# Patient Record
Sex: Female | Born: 1975 | Race: White | Hispanic: No | Marital: Married | Smoking: Never smoker
Health system: Southern US, Community
[De-identification: ages and names within clinical notes are randomized; demographics above are authoritative.]

## PROBLEM LIST (undated history)

## (undated) DIAGNOSIS — J129 Viral pneumonia, unspecified: Secondary | ICD-10-CM

## (undated) DIAGNOSIS — N6310 Unspecified lump in the right breast, unspecified quadrant: Secondary | ICD-10-CM

## (undated) DIAGNOSIS — Z9289 Personal history of other medical treatment: Secondary | ICD-10-CM

## (undated) DIAGNOSIS — L709 Acne, unspecified: Secondary | ICD-10-CM

## (undated) DIAGNOSIS — N920 Excessive and frequent menstruation with regular cycle: Secondary | ICD-10-CM

## (undated) DIAGNOSIS — N939 Abnormal uterine and vaginal bleeding, unspecified: Secondary | ICD-10-CM

## (undated) HISTORY — DX: Excessive and frequent menstruation with regular cycle: N92.0

## (undated) HISTORY — DX: Abnormal uterine and vaginal bleeding, unspecified: N93.9

## (undated) HISTORY — PX: BREAST ENHANCEMENT SURGERY: SHX7

## (undated) HISTORY — DX: Personal history of other medical treatment: Z92.89

## (undated) HISTORY — DX: Viral pneumonia, unspecified: J12.9

## (undated) HISTORY — DX: Acne, unspecified: L70.9

## (undated) HISTORY — DX: Unspecified lump in the right breast, unspecified quadrant: N63.10

---

## 1999-03-25 ENCOUNTER — Other Ambulatory Visit: Admission: RE | Admit: 1999-03-25 | Discharge: 1999-03-25 | Payer: Self-pay | Admitting: *Deleted

## 2005-09-22 ENCOUNTER — Observation Stay: Payer: Self-pay | Admitting: Unknown Physician Specialty

## 2005-09-24 ENCOUNTER — Ambulatory Visit: Payer: Self-pay | Admitting: Unknown Physician Specialty

## 2005-10-18 ENCOUNTER — Observation Stay: Payer: Self-pay

## 2005-10-26 ENCOUNTER — Inpatient Hospital Stay: Payer: Self-pay | Admitting: Unknown Physician Specialty

## 2005-10-31 ENCOUNTER — Inpatient Hospital Stay: Payer: Self-pay

## 2007-04-21 HISTORY — PX: ABLATION: SHX5711

## 2007-07-05 HISTORY — PX: OTHER SURGICAL HISTORY: SHX169

## 2008-04-20 HISTORY — PX: LAPAROSCOPY: SHX197

## 2008-06-21 ENCOUNTER — Ambulatory Visit: Payer: Self-pay | Admitting: Unknown Physician Specialty

## 2008-06-26 ENCOUNTER — Ambulatory Visit: Payer: Self-pay | Admitting: Unknown Physician Specialty

## 2008-08-21 ENCOUNTER — Ambulatory Visit: Payer: Self-pay | Admitting: Internal Medicine

## 2009-04-06 ENCOUNTER — Emergency Department: Payer: Self-pay | Admitting: Emergency Medicine

## 2011-04-21 HISTORY — PX: BUNIONECTOMY: SHX129

## 2011-06-16 ENCOUNTER — Ambulatory Visit: Payer: Self-pay | Admitting: Obstetrics & Gynecology

## 2011-06-16 DIAGNOSIS — I499 Cardiac arrhythmia, unspecified: Secondary | ICD-10-CM

## 2011-06-16 LAB — CBC
HCT: 38.8 % (ref 35.0–47.0)
MCV: 93 fL (ref 80–100)
Platelet: 201 10*3/uL (ref 150–440)
RBC: 4.15 10*6/uL (ref 3.80–5.20)
RDW: 12.2 % (ref 11.5–14.5)
WBC: 3.4 10*3/uL — ABNORMAL LOW (ref 3.6–11.0)

## 2011-06-25 ENCOUNTER — Ambulatory Visit: Payer: Self-pay | Admitting: Obstetrics & Gynecology

## 2011-06-25 HISTORY — PX: ENDOMETRIAL ABLATION: SHX621

## 2011-06-25 HISTORY — PX: COMBINED HYSTEROSCOPY DIAGNOSTIC / D&C: SUR297

## 2011-06-25 LAB — PREGNANCY, URINE: Pregnancy Test, Urine: NEGATIVE m[IU]/mL

## 2011-08-06 ENCOUNTER — Ambulatory Visit: Payer: Self-pay | Admitting: Podiatry

## 2011-09-19 DIAGNOSIS — J129 Viral pneumonia, unspecified: Secondary | ICD-10-CM

## 2011-09-19 HISTORY — DX: Viral pneumonia, unspecified: J12.9

## 2011-10-05 ENCOUNTER — Ambulatory Visit: Payer: Self-pay | Admitting: Otolaryngology

## 2013-01-18 DIAGNOSIS — N6310 Unspecified lump in the right breast, unspecified quadrant: Secondary | ICD-10-CM

## 2013-01-18 HISTORY — DX: Unspecified lump in the right breast, unspecified quadrant: N63.10

## 2013-02-20 HISTORY — PX: OTHER SURGICAL HISTORY: SHX169

## 2013-02-22 ENCOUNTER — Ambulatory Visit: Payer: Self-pay | Admitting: General Surgery

## 2013-03-23 HISTORY — PX: OTHER SURGICAL HISTORY: SHX169

## 2013-03-23 NOTE — Unmapped External Note (Signed)
 Detailed Operative Note (CSN: [id number])   Date of Surgery: 03/23/2013   Pre-op Diagnosis: right breast atypia  Post-op Diagnosis: same  Procedure(s): 1. MASTECTOMY, PARTIAL (EG, LUMPECTOMY, TYLECTOMY, QUADRANTECTOMY, SEGMENTECTOMY)  2. INTRAOPERATIVE UTILIZATION OF ULTRASOUND FOR GUIDANCE AND EVALUATION OF SPECIMENS  3. TISSUE REARRANGEMENT, LESS THAN 30 CM SQUARED  Performing Service: Surgical Oncology Surgeon(s) and Role:    * Tasha Shawnee Shaggy, DO - Primary    * Alan Deed, DO - Resident - Assisting   Findings: right breast lesion located with needle localization and ultrasound guidance and removed. Specimen mammographic images were obtained. Initial image did not have the clip. Additional lateral margin was sent and showed a clip in center of lesion and appearance of rim of normal tissue around area of concern.  Anesthesia: GETA  Estimated Blood Loss: 20 cc  Complications: None  Specimens:  1. right breast lumpectomy, marked short stitch superior, long stitch lateral   Detailed Operative Note:  Indications: Tasha Garcia, a 37 y.o.-year-old female had an abnormal mammogram. Biopsy of this area showed atypia.  After discussion of alternatives, the patient elected breast conservation therapy. Accordingly, needle localized lumpectomy was planned.  Description of procedure: The breast lesion was localized pre-operatively with needle localization. The patient was brought to the operating room and the side of surgery was verified. General anesthesia was induced. A time-out was completed verifying correct patient, procedure, site, positioning, and special equipment prior to beginning this procedure. The breast and chest wall was then prepped and draped in the usual sterile fashion.   The lesion was identified using ultrasound guidance of the guide wire. A circumareolar incision was made. Flaps were developed and the lesion was resected intact with the guidewire and  oriented. Ultrasound was performed of the lumpectomy specimen and the wire was located in the center of the specimen with a good margin of tissue surrounding the needle. It was then sent for specimen mammography and confirmation was received that the entire target lesion had been resected. The specimen was sent to pathology. The wound was irrigated with dilute hydrogen peroxide and water. Hemostasis was achieved with electrocautery and suture ligation. Tissue rearrangement was performed mobilizing the surrounding breast tissue from the pectoralis fascia and approximating it in a way to minimize the lumpectomy defect measuring 3 cm x 5 cm x 2 cm. The wound was closed with interrupted sutures of 3-0 Vicryl and a subcuticular suture of 4-0 Vicryl. Dermabond dressing was used over the incision. At the end of the operation, all sponge, instrument, and needle counts x 2 were correct.  The patient tolerated the procedure well and was taken to the postanesthesia care unit in satisfactory condition.   Surgeon Notes: I was present and scrubbed for the entire procedure  Tasha Gallagher, DO 03/23/2013 9:51 PM

## 2014-02-08 LAB — HM MAMMOGRAPHY

## 2014-02-12 NOTE — Progress Notes (Signed)
 Patient Name: Tasha Garcia Patient Age: 38 y.o. Encounter Date: 02/12/2014  Referring Physician:  Almarie DELENA Fox, MD [address] [facility] [id number] [place], [place] [address]  Primary Care Provider: Almarie DELENA Fox, MD (General)   Reason for Visit: abnormal right mammogram, history of atypia   HPI Tasha Garcia is a 38 y.o. female who is well known to the breast service with right breast flat epithelial atypia. She presents today for a repeat mammogram and exam. Historically, she presented with a history of a right breast mass that was self detected. Imaging revealed 3mm of microcalcification. She had a biopsy that showed atypia and underwent a right breast needle localized excisional biopsy on 04/02/14 that showed papilloma, FEA and radial scar. No in situ or invasive carcinoma. She returns for follow up with a bilateral mammogram done at [facility] on 02/08/14 that was BIRADS 2. She has noticed a thickening of the right breast since surgery. She denies new breast masses, nipple discharge, or other breast changes. She has history of bilateral breast augmentation in 2013.  She also has a family history of a maternal grandmother with premenopausal breast cancer. Her family tree is female dominated. She plans to undergo genetic testing with her gynecologist at home. We discussed meeting with a geneticist at [facility], but she would like to have testing done closer to home. We discussed that should she be a genetic carrier we would discuss recommendation for high risk screening and prophylactic procedures. Otherwise she may return to annual screening mammography and routine follow up with her primary physician.  FH:  Maternal grandmother with diagnosis of premenopausal breast cancer, age 59, deceased age 30. Maternal uncle leukemia age 30. No other breast or ovarian cancers.  Maternal grandmother had 4 children, 3 boys and 1 girl (patient's mother). One of the female children had leukemia age  8 and is deceased.  Tasha Garcia is one of two children. She has a brother.    Medical history reviewed as below. I have also reviewed additional records as provided. No past medical history on file. Past Surgical History  Procedure Laterality Date  . Cesarean section    . Foot surgery    . Uterine ablation    . Breast surgery      Saline Implants  . Pr mastectomy, partial Right 03/23/2013    Procedure: MASTECTOMY, PARTIAL (EG, LUMPECTOMY, TYLECTOMY, QUADRANTECTOMY, SEGMENTECTOMY);  Surgeon: Kristalyn Kay Gallagher, DO;  [facility];  Service: Surgical Oncology  . Pr adj tiss xfer any area,30.1-60 sqcm Right 03/23/2013    Procedure: ADJACENT TISSUE TRANSFER OR REARRANGEMENT, ANY AREA; DEFECT 30.1 SQ CM TO 60.0 SQ CM;  Surgeon: Kristalyn Kay Gallagher, DO;  [facility];  Service: Surgical Oncology  . Breast biopsy Right     benign   Family History  Problem Relation Age of Onset  . Cancer Father 63    SKIN CANCER  . Breast cancer Maternal Grandmother 48  . Cancer Maternal Uncle 6    leukemia   History   Social History  . Marital Status: Married    Spouse Name: N/A    Number of Children: N/A  . Years of Education: N/A   Occupational History  . Not on file.   Social History Main Topics  . Smoking status: Former Smoker -- 0.25 packs/day for 5 years  . Smokeless tobacco: Not on file  . Alcohol Use: 1.2 oz/week    2 Cans of beer per week  .  Drug Use: No  . Sexual Activity: Not on file   Other Topics Concern  . Not on file   Social History Narrative  Exercises daily. Lives at home with her husband and children. Former smoker, none currently. Occasional alcohol, no drug use.  Current outpatient prescriptions: docusate sodium (COLACE) 100 MG capsule, Take 100 mg by mouth nightly as needed., Disp: , Rfl: ;  ibuprofen (ADVIL,MOTRIN) 200 MG tablet, Take 600 mg by mouth every six (6) hours as needed for pain., Disp: , Rfl: ;  nitrofurantoin, macrocrystal-monohydrate,  (MACROBID) 100 MG capsule, Take 100 mg by mouth continuous as needed. , Disp: , Rfl:  omeprazole (PRILOSEC) 20 MG capsule, Take 20 mg by mouth daily., Disp: , Rfl: ;  rizatriptan (MAXALT) 5 MG tablet, Take 5 mg by mouth as needed for migraine. May repeat in 2 hours if needed, Disp: , Rfl: ;  ROXICET 5-325 mg per tablet, Take 1 tablet by mouth every four (4) hours as needed for pain., Disp: 20 tablet, Rfl: 0 ZOFRAN, AS HYDROCHLORIDE, 4 mg tablet, Take 1 tablet (4 mg total) by mouth every eight (8) hours as needed for nausea., Disp: 30 tablet, Rfl: 1 Allergies  Allergen Reactions  . Erythromycin Other (See Comments)    Unknown, happened as a child    Reproductive History: G3P3 with first pregnancy at age 21. Menarche reported at age 42. LMP 06/2011 secondary to ablation. OCPs yes, for 14 years, stopped age 70. HRT no.  Review of Systems  10 organ systems reviewed and pertinent as noted in HPI.    Physical Examination:  Blood pressure 129/65, pulse 80, temperature 36.7 C (98.1 F), temperature source Oral, resp. rate 16, height 157.5 cm (5' 2.01), weight 58.423 kg (128 lb 12.8 oz). General Appearance:  No acute distress, well appearing and well nourished.  Head:  Normocephalic, atraumatic.  Eyes:  Conjuctiva and lids appear normal. Pupils equal and round,  sclera anicteric.  Ears:  Overall appearance normal with no scars, lesions or               masses.  Hearing is grossly normal.  Nose: Nares grossly normal, no drainage.  Throat: Lips, mucosa, and tongue normal; teeth and gums normal.  Neck: Supple, symmetrical, trachea midline, no adenopathy;  thyroid:  no enlargement/tenderness/nodules.  Pulmonary:    Normal respiratory effort.  Lungs were clear to auscultation bilaterally.  Cardiovascular:  Regular rate and rhythm, no murmur noted.  No thrill noted.  Abdomen:   Soft, non-tender, without masses.  No                                      hepatosplenomegaly. No hernias appreciated.  Normoactive bowel sounds.  Musculoskeletal: Normal gait.  Extremities without clubbing, cyanosis, or           edema.  Skin: Skin color, texture, turgor normal, no rashes or lesions.  Neurologic: No motor abnormalities noted.  Sensation grossly intact.  Lymphatic: Breast: No cervical or supraclavicular LAD noted.  A comprehensive examination of the breasts and chest wall was performed with the patient upright and supine with arms at her sides and above her head.  Right breast normal in size, normal symmetry, normal contour, no dimpling, no skin changes, nipple everted, nipple exhibits no crusting or excoriation, no masses or nodules, no palpable axillary adenopathy, right periareolar incision well healed. Cosmesis is good. She has bilateral breast  implants with inframammary incisions. She has deviation of her implants inferior to the IMF and lateral. Left breast normal in size, normal symmetry, normal contour, no dimpling, no skin changes, nipple everted, nipple exhibits no crusting or excoriation, no masses or nodules, no palpable axillary adenopathy.  Psychiatric: Judgement and insight appropriate.  Oriented to person,         place, and time.    Diagnostic Studies: I personally reviewed all diagnostic imaging, including diagnostic mammogram with spot compression/magnification views. Diagnostic mammography, performed on 02/08/14 at [facility], reveals no abnormalities, BIRADS 2.   Assessment/Plan: Tasha Garcia is a 38 y.o. 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  After reviewing all available records and imaging, we had a frank and thorough discussion regarding her course of care. Current imaging is normal, BIRADS 2.   We specifically discussed:  That her imaging today was BIRADS 2 no abnormalities.  The option of meeting with a geneticist to discuss possible genetic testing secondary to her family history of a maternal grandmother with premenopausal breast cancer and a female  dominated family tree. She plans to undergo genetic testing with her gynecologist at home.  We discussed that should she be a genetic carrier she should follow up to discuss risk reduction strategies.  The recommendation for annual screening mammography and routine follow up with her primary physician.   I spent 30 minutes with Devere, greater than 50% of that time in counseling and coordination of care. All of the patient's and family's questions and concerns were addressed during the visit and they are in agreement with the plan of care. Please do not hesitate to contact me with questions or concerns.  Kristalyn K Gallagher, DO Surgical Breast Oncology and Oncoplastics 02/12/2014 9:50 AM

## 2014-03-01 ENCOUNTER — Ambulatory Visit: Payer: Self-pay | Admitting: Podiatry

## 2014-08-12 NOTE — Op Note (Signed)
PATIENT NAME:  Tasha Garcia, Tasha Garcia MR#:  [id number] DATE OF BIRTH:  [birth year]  DATE OF PROCEDURE:  06/25/2011  PREOPERATIVE DIAGNOSIS: Menorrhagia.   POSTOPERATIVE DIAGNOSIS: Menorrhagia.  PROCEDURES PERFORMED:  1. Hysteroscopy.  2. Dilatation and curettage. 3. NovaSure endometrial ablation.   SURGEON: Barnett Applebaum, M.D.   ANESTHESIA: General.   ESTIMATED BLOOD LOSS: Minimal.   COMPLICATIONS: None.   FINDINGS: Proliferative endometrium.   DISPOSITION: To the Recovery Room in stable condition.   TECHNIQUE: The patient is prepped and draped in the usual sterile fashion, after adequate anesthesia is obtained, in the dorsal lithotomy position. The bladder is drained with a Robinson catheter, a speculum is placed, and the anterior lip of the cervix is grasped with a tenaculum. The uterus is measured with sounding length to 9 cm, cervical length 3 cm, and cavity length 6 cm. Then the cervix is gently dilated to size 18 Pratt dilator. A 30 degree hysteroscope, with lactated Ringer's solution, with distention of the intrauterine cavity is performed with proliferative endometrium noted, but no obvious polyps or fibroids. The hysteroscope is removed and the NovaSure device is placed. Cavity width of 3.8 cm is measured. Machine test is performed and then the NovaSure endometrial ablation procedure is performed on a power 125 which takes 60 seconds. The device is removed and repeat inspection with the hysteroscope reveals appropriate blanching of the endometrial lining with no signs of bleeding, perforation, or other injury. The hysteroscope is removed. The patient goes to the Recovery Room in stable condition.   It is important to note that the endometrial curettage was obtained prior to performing the endometrial ablation and this is sent to pathology for further review.   The patient goes to the Recovery Room n stable condition. All sponge, instrument, and needle counts are  correct. ____________________________ R. Barnett Applebaum, MD rph:slb D: 06/25/2011 14:32:20 ET T: 06/25/2011 14:38:01 ET JOB#: [id number]  cc: Glean Salen, MD, <Dictator> Gae Dry MD ELECTRONICALLY SIGNED 06/26/2011 9:11

## 2015-03-06 DIAGNOSIS — Z9289 Personal history of other medical treatment: Secondary | ICD-10-CM

## 2015-03-06 HISTORY — DX: Personal history of other medical treatment: Z92.89

## 2015-03-06 LAB — HM PAP SMEAR: HM PAP: NEGATIVE

## 2016-07-14 ENCOUNTER — Telehealth: Payer: Self-pay

## 2016-07-14 NOTE — Telephone Encounter (Signed)
Pt calling.  She was a pt of [facility] for 70yr.  She requested refill on her progesterone cream 5d ago which she thinks is ample time.but it's still not refilled.  Also, if refill could be for a year instead of six months, it would save a lot of headaches.  She is now completely of the progesterone cream.  [phone]

## 2016-07-15 ENCOUNTER — Other Ambulatory Visit: Payer: Self-pay

## 2016-07-15 NOTE — Telephone Encounter (Signed)
-----   Message from Gae Dry, MD sent at 07/14/2016  3:47 PM EDT ----- Regarding: appt Please arrange Annual Appt for pt w self as convienant for patient.  Refill will be called in for her progesterone for the next month or so.

## 2016-07-15 NOTE — Telephone Encounter (Signed)
Per pt she wants to see ABC please advise if you will refill pt progesterone cream.

## 2016-07-15 NOTE — Telephone Encounter (Signed)
Pt is schedule 5/88/32 with Elmo Putt Copland

## 2016-07-16 ENCOUNTER — Other Ambulatory Visit: Payer: Self-pay | Admitting: Obstetrics and Gynecology

## 2016-07-16 MED ORDER — AMBULATORY NON FORMULARY MEDICATION: 1 refills | Status: DC

## 2016-07-16 NOTE — Telephone Encounter (Signed)
RN to fax Rx to [facility] and notify pt.

## 2016-07-16 NOTE — Telephone Encounter (Signed)
Left voice message letting pt know rx sent

## 2016-08-27 ENCOUNTER — Ambulatory Visit (INDEPENDENT_AMBULATORY_CARE_PROVIDER_SITE_OTHER): Payer: 59 | Admitting: Obstetrics and Gynecology

## 2016-08-27 ENCOUNTER — Encounter: Payer: Self-pay | Admitting: Obstetrics and Gynecology

## 2016-08-27 VITALS — BP 100/60 | HR 72 | Ht 62.0 in | Wt 131.0 lb

## 2016-08-27 DIAGNOSIS — Z79890 Hormone replacement therapy (postmenopausal): Secondary | ICD-10-CM | POA: Diagnosis not present

## 2016-08-27 DIAGNOSIS — Z803 Family history of malignant neoplasm of breast: Secondary | ICD-10-CM

## 2016-08-27 DIAGNOSIS — Z01419 Encounter for gynecological examination (general) (routine) without abnormal findings: Secondary | ICD-10-CM

## 2016-08-27 DIAGNOSIS — Z1239 Encounter for other screening for malignant neoplasm of breast: Secondary | ICD-10-CM

## 2016-08-27 DIAGNOSIS — Z1231 Encounter for screening mammogram for malignant neoplasm of breast: Secondary | ICD-10-CM | POA: Diagnosis not present

## 2016-08-27 MED ORDER — AMBULATORY NON FORMULARY MEDICATION: 11 refills | Status: DC

## 2016-08-27 NOTE — Progress Notes (Signed)
Chief Complaint  Patient presents with  . Gynecologic Exam     HPI:      [name] is a 41 y.o. [phone] who LMP was No LMP recorded., presents today for her annual examination.  Her menses are absent due to ablation. She does not have intermenstrual bleeding.  Sex activity: single partner, contraception - vasectomy.  Last Pap: March 06, 2015  Results were: no abnormalities /neg HPV DNA   There is a FH of breast cancer in her MGM, genetic testing not indicated. There is no FH of ovarian cancer. The patient does do self-breast exams. She had a breast mass removed a few yrs ago and is still followed at [facility] with mammograms. She had a neg mammo 10/17.  Tobacco use: The patient denies current or previous tobacco use. Alcohol use: social drinker Exercise: very active  She does get adequate calcium and Vitamin D in her diet.    Past Medical History:  Diagnosis Date  . Abnormal uterine bleeding   . Breast mass, right 01/2013  . History of mammogram 02/06/2013; 02/08/2014   MAMM REC ADD VIEWS-ADD VIEWS REC BX-BX REC SUIRG - BENIGN;  BENIGN  . History of Papanicolaou smear of cervix 03/06/2015   -/-  . Menorrhagia   . Pneumonia, viral 09/2011   walking pneumonia/sinus inf. - took antibxs    Past Surgical History:  Procedure Laterality Date  . BREAST ENHANCEMENT SURGERY    . BUNIONECTOMY  2013  . CESAREAN SECTION     X3  . COMBINED HYSTEROSCOPY DIAGNOSTIC / D&C  06/25/2011   MENORRHAGIA  . ENDOMETRIAL ABLATION  06/25/2011   NOVASURE  . LAPAROSCOPY  2010   [facility]  . LUMPECTOMY, RIGHT BREAST  03/23/2013   DR. GALLAGHER @ [facility]  . right breast biopsy  02/20/2013   DR. Martinique @ [facility]  . SONOHYSTEROGRAM  07/05/2007    Family History  Problem Relation Age of Onset  . Hyperlipidemia Mother   . Hypertension Mother   . Hyperlipidemia Father   . Hypertension Father   . Breast cancer Maternal Grandmother 18    Social History   Social History  . Marital status:  Married    Spouse name: N/A  . Number of children: 3  . Years of education: N/A   Occupational History  . SALES    Social History Main Topics  . Smoking status: Never Smoker  . Smokeless tobacco: Never Used  . Alcohol use Yes     Comment: OCC  . Drug use: No  . Sexual activity: Yes    Birth control/ protection: Surgical   Other Topics Concern  . Not on file   Social History Narrative  . No narrative on file     Current Outpatient Prescriptions:  .  rizatriptan (MAXALT) 10 MG tablet, Take by mouth., Disp: , Rfl:  .  famotidine (PEPCID) 20 MG tablet, Take by mouth., Disp: , Rfl:  .  ibuprofen (ADVIL,MOTRIN) 200 MG tablet, Take 600 mg by mouth., Disp: , Rfl:  .  omeprazole (PRILOSEC) 40 MG capsule, Take 40 mg by mouth every morning., Disp: , Rfl: 3  ROS:  Review of Systems  Constitutional: Negative for fever, malaise/fatigue and weight loss.  HENT: Negative for congestion, ear pain and sinus pain.   Respiratory: Negative for cough, shortness of breath and wheezing.   Cardiovascular: Negative for chest pain, orthopnea and leg swelling.  Gastrointestinal: Negative for constipation, diarrhea, nausea and vomiting.  Genitourinary: Negative for  dysuria, frequency, hematuria and urgency.       Breast ROS: negative   Musculoskeletal: Negative for back pain, joint pain and myalgias.  Skin: Negative for itching and rash.  Neurological: Negative for dizziness, tingling, focal weakness and headaches.  Endo/Heme/Allergies: Negative for environmental allergies. Does not bruise/bleed easily.  Psychiatric/Behavioral: Negative for depression and suicidal ideas. The patient is not nervous/anxious and does not have insomnia.     Objective: BP 100/60   Pulse 72   Ht 5' 2"  (1.575 m)   Wt 131 lb (59.4 kg)   BMI 23.96 kg/m    Physical Exam  Constitutional: She is oriented to person, place, and time. She appears well-developed and well-nourished.  Genitourinary: Vagina normal and  uterus normal. There is no rash or tenderness on the right labia. There is no rash or tenderness on the left labia. No erythema or tenderness in the vagina. No vaginal discharge found. Right adnexum does not display mass and does not display tenderness. Left adnexum does not display mass and does not display tenderness. Cervix does not exhibit motion tenderness or polyp. Uterus is not enlarged or tender.  Neck: Normal range of motion. No thyromegaly present.  Cardiovascular: Normal rate, regular rhythm and normal heart sounds.   No murmur heard. Pulmonary/Chest: Effort normal and breath sounds normal. Right breast exhibits no mass, no nipple discharge, no skin change and no tenderness. Left breast exhibits no mass, no nipple discharge, no skin change and no tenderness.  Abdominal: Soft. There is no tenderness. There is no guarding.  Musculoskeletal: Normal range of motion.  Neurological: She is alert and oriented to person, place, and time. No cranial nerve deficit.  Psychiatric: She has a normal mood and affect. Her behavior is normal.  Vitals reviewed.   Assessment/Plan: Encounter for annual routine gynecological examination  Screening for breast cancer - Pt has mammos at [facility]. - Plan: CANCELED: MM SCREENING BREAST TOMO BILATERAL  Family history of breast cancer - Doesn't currently qualify for cancer genetic testing. Will cont to follow FH. - Plan: CANCELED: MM SCREENING BREAST TOMO BILATERAL  Hormone replacement therapy (HRT) - Pt uses progesteron crm, compounded at [facility], for acne with sx relief.              GYN counsel mammography screening, adequate intake of calcium and vitamin D     F/U  Return in about 1 year (around 08/27/2017).  Talor Desrosiers B. Vijay Durflinger, PA-C 08/27/2016 11:14 AM

## 2016-09-02 ENCOUNTER — Ambulatory Visit
Admission: RE | Admit: 2016-09-02 | Discharge: 2016-09-02 | Disposition: A | Discharge status: Home / Self Care | Payer: 59 | Source: Ambulatory Visit | Attending: Chiropractic Medicine | Admitting: Chiropractic Medicine

## 2016-09-02 ENCOUNTER — Other Ambulatory Visit: Payer: Self-pay | Admitting: Chiropractic Medicine

## 2016-09-02 DIAGNOSIS — M2560 Stiffness of unspecified joint, not elsewhere classified: Secondary | ICD-10-CM

## 2016-09-02 DIAGNOSIS — M542 Cervicalgia: Secondary | ICD-10-CM | POA: Diagnosis present

## 2017-09-06 ENCOUNTER — Telehealth: Payer: Self-pay

## 2017-09-06 ENCOUNTER — Other Ambulatory Visit: Payer: Self-pay

## 2017-09-06 ENCOUNTER — Other Ambulatory Visit: Payer: Self-pay | Admitting: Obstetrics and Gynecology

## 2017-09-06 DIAGNOSIS — Z79890 Hormone replacement therapy (postmenopausal): Secondary | ICD-10-CM

## 2017-09-06 MED ORDER — AMBULATORY NON FORMULARY MEDICATION: 0 refills | Status: DC

## 2017-09-06 NOTE — Telephone Encounter (Signed)
RN to fax and notify pt.

## 2017-09-06 NOTE — Progress Notes (Signed)
Rx RF till annual. RN to fax.

## 2017-09-06 NOTE — Telephone Encounter (Signed)
Pt states she scheduled her annual for 6/6. She needs her Progesterone called in to the [facility] in [place]. It is due tomorrow 5/21. CB# [phone]

## 2017-09-06 NOTE — Telephone Encounter (Signed)
Rx called in to [facility], pt notified

## 2017-09-23 ENCOUNTER — Encounter: Payer: Self-pay | Admitting: Obstetrics and Gynecology

## 2017-09-23 ENCOUNTER — Ambulatory Visit (INDEPENDENT_AMBULATORY_CARE_PROVIDER_SITE_OTHER): Payer: 59 | Admitting: Obstetrics and Gynecology

## 2017-09-23 VITALS — BP 130/82 | HR 75 | Ht 63.0 in | Wt 132.5 lb

## 2017-09-23 DIAGNOSIS — N644 Mastodynia: Secondary | ICD-10-CM | POA: Diagnosis not present

## 2017-09-23 DIAGNOSIS — L700 Acne vulgaris: Secondary | ICD-10-CM | POA: Diagnosis not present

## 2017-09-23 DIAGNOSIS — Z1239 Encounter for other screening for malignant neoplasm of breast: Secondary | ICD-10-CM

## 2017-09-23 DIAGNOSIS — Z79890 Hormone replacement therapy (postmenopausal): Secondary | ICD-10-CM

## 2017-09-23 DIAGNOSIS — Z803 Family history of malignant neoplasm of breast: Secondary | ICD-10-CM | POA: Diagnosis not present

## 2017-09-23 DIAGNOSIS — Z01419 Encounter for gynecological examination (general) (routine) without abnormal findings: Secondary | ICD-10-CM

## 2017-09-23 DIAGNOSIS — Z1231 Encounter for screening mammogram for malignant neoplasm of breast: Secondary | ICD-10-CM | POA: Diagnosis not present

## 2017-09-23 MED ORDER — AMBULATORY NON FORMULARY MEDICATION: 12 refills | Status: DC

## 2017-09-23 NOTE — Progress Notes (Signed)
Chief Complaint  Patient presents with  . Gynecologic Exam    Breast tenderness      HPI:      [name] is a 42 y.o. [phone] who LMP was No LMP recorded. Patient has had an ablation., presents today for her annual examination.  Her menses are absent due to ablation. She does not have intermenstrual bleeding.  Sex activity: single partner, contraception - vasectomy.  Last Pap: March 06, 2015  Results were: no abnormalities /neg HPV DNA   Mammogram: followed at [facility]. Didn't have one last yr but will do it this yr. She had a breast mass removed a few yrs ago and is still followed at [facility]. She had a neg mammo 10/17. There is a FH of breast cancer in her MGM, genetic testing not indicated. There is no FH of ovarian cancer. The patient does do self-breast exams. She has noted increased breast tenderness recently, sx intensity waxes and wanes. Drinks coffee in the AM.   Tobacco use: The patient denies current or previous tobacco use. Alcohol use: social drinker Exercise: very active  She does get adequate calcium and Vitamin D in her diet.  She uses prog crm 6 days on, 1 day off for acne with sx control. She needs Rx RF. She has had a small of increased acne around her mouth recently. She wonders if dose needs to be adjusted.   Past Medical History:  Diagnosis Date  . Abnormal uterine bleeding   . Breast mass, right 01/2013  . History of mammogram 02/06/2013; 02/08/2014   MAMM REC ADD VIEWS-ADD VIEWS REC BX-BX REC SUIRG - BENIGN;  BENIGN  . History of Papanicolaou smear of cervix 03/06/2015   -/-  . Menorrhagia   . Pneumonia, viral 09/2011   walking pneumonia/sinus inf. - took antibxs    Past Surgical History:  Procedure Laterality Date  . ABLATION  2009  . BREAST ENHANCEMENT SURGERY    . BUNIONECTOMY  2013  . CESAREAN SECTION     X3  . COMBINED HYSTEROSCOPY DIAGNOSTIC / D&C  06/25/2011   MENORRHAGIA  . ENDOMETRIAL ABLATION  06/25/2011   NOVASURE  .  LAPAROSCOPY  2010   BVD  . LUMPECTOMY, RIGHT BREAST  03/23/2013   DR. GALLAGHER @ [facility]  . right breast biopsy  02/20/2013   DR. Martinique @ [facility]  . SONOHYSTEROGRAM  07/05/2007    Family History  Problem Relation Age of Onset  . Hyperlipidemia Mother   . Hypertension Mother   . Hyperlipidemia Father   . Hypertension Father   . Breast cancer Maternal Grandmother 40    Social History   Socioeconomic History  . Marital status: Married    Spouse name: Not on file  . Number of children: 3  . Years of education: Not on file  . Highest education level: Not on file  Occupational History  . Occupation: Press photographer  Social Needs  . Financial resource strain: Not on file  . Food insecurity:    Worry: Not on file    Inability: Not on file  . Transportation needs:    Medical: Not on file    Non-medical: Not on file  Tobacco Use  . Smoking status: Never Smoker  . Smokeless tobacco: Never Used  Substance and Sexual Activity  . Alcohol use: Yes    Comment: OCC  . Drug use: No  . Sexual activity: Yes    Birth control/protection: Surgical    Comment: Vesectomy  Lifestyle  . Physical activity:    Days per week: Not on file    Minutes per session: Not on file  . Stress: Not on file  Relationships  . Social connections:    Talks on phone: Not on file    Gets together: Not on file    Attends religious service: Not on file    Active member of club or organization: Not on file    Attends meetings of clubs or organizations: Not on file    Relationship status: Not on file  . Intimate partner violence:    Fear of current or ex partner: Not on file    Emotionally abused: Not on file    Physically abused: Not on file    Forced sexual activity: Not on file  Other Topics Concern  . Not on file  Social History Narrative  . Not on file    Current Outpatient Medications on File Prior to Visit  Medication Sig Dispense Refill  . amoxicillin-clavulanate (AUGMENTIN) 875-125 MG tablet Take 1  tablet by mouth every 12 (twelve) hours.  0  . ibuprofen (ADVIL,MOTRIN) 200 MG tablet Take 600 mg by mouth.    Marland Kitchen omeprazole (PRILOSEC) 40 MG capsule Take 40 mg by mouth every morning.  3  . rizatriptan (MAXALT) 10 MG tablet Take by mouth.     No current facility-administered medications on file prior to visit.       ROS:  Review of Systems  Constitutional: Negative for fatigue, fever and unexpected weight change.  Respiratory: Negative for cough, shortness of breath and wheezing.   Cardiovascular: Negative for chest pain, palpitations and leg swelling.  Gastrointestinal: Negative for blood in stool, constipation, diarrhea, nausea and vomiting.  Endocrine: Negative for cold intolerance, heat intolerance and polyuria.  Genitourinary: Negative for dyspareunia, dysuria, flank pain, frequency, genital sores, hematuria, menstrual problem, pelvic pain, urgency, vaginal bleeding, vaginal discharge and vaginal pain.  Musculoskeletal: Negative for back pain, joint swelling and myalgias.  Skin: Negative for rash.  Neurological: Negative for dizziness, syncope, light-headedness, numbness and headaches.  Hematological: Negative for adenopathy.  Psychiatric/Behavioral: Negative for agitation, confusion, sleep disturbance and suicidal ideas. The patient is not nervous/anxious.   BREAST: Pos for tenderness   Objective: BP 130/82   Pulse 75   Ht 5' 3"  (1.6 m)   Wt 132 lb 8 oz (60.1 kg)   BMI 23.47 kg/m    Physical Exam  Constitutional: She is oriented to person, place, and time. She appears well-developed and well-nourished.  Genitourinary: Vagina normal and uterus normal. There is no rash or tenderness on the right labia. There is no rash or tenderness on the left labia. No erythema or tenderness in the vagina. No vaginal discharge found. Right adnexum does not display mass and does not display tenderness. Left adnexum does not display mass and does not display tenderness. Cervix does not  exhibit motion tenderness or polyp. Uterus is not enlarged or tender.  Neck: Normal range of motion. No thyromegaly present.  Cardiovascular: Normal rate, regular rhythm and normal heart sounds.  No murmur heard. Pulmonary/Chest: Effort normal and breath sounds normal. Right breast exhibits no mass, no nipple discharge, no skin change and no tenderness. Left breast exhibits no mass, no nipple discharge, no skin change and no tenderness.  Abdominal: Soft. There is no tenderness. There is no guarding.  Musculoskeletal: Normal range of motion.  Neurological: She is alert and oriented to person, place, and time. No cranial nerve deficit.  Psychiatric: She  has a normal mood and affect. Her behavior is normal.  Vitals reviewed.   Assessment/Plan: Encounter for annual routine gynecological examination  Screening for breast cancer - Pt gets mammos at [facility]  Family history of breast cancer - Doesn't qualify for cancer genetic testing but will cont to monitor FH.   Acne vulgaris - Rx RF prog. Cont current dose and f/u prn.  - Plan: AMBULATORY NON FORMULARY MEDICATION  Breast tenderness - NEg exam. D/C caffeine to see if helps. May need to adjust prog dose but still have ovaries so should have sufficient hormones. See if sx are cyclical.  Hormone replacement therapy (HRT) - Pt uses progesteron crm, compounded at [facility], for acne with sx relief.  - Plan: AMBULATORY NON FORMULARY MEDICATION  Meds ordered this encounter  Medications  . AMBULATORY NON FORMULARY MEDICATION    Sig: Progesterone 0.5% cream Apply 1 ml topically daily 6 days on, 1 day off    Dispense:  30 mL    Refill:  12    Order Specific Question:   Supervising Provider    Answer:   Gae Dry [[id number]]             GYN counsel breast self exam, mammography screening, adequate intake of calcium and vitamin D, diet and exercise     F/U  Return in about 1 year (around 09/24/2018).  Alicia B. Copland, PA-C 09/23/2017 12:20  PM

## 2017-09-23 NOTE — Patient Instructions (Signed)
I value your feedback and entrusting us with your care. If you get a  patient survey, I would appreciate you taking the time to let us know about your experience today. Thank you! 

## 2018-10-03 ENCOUNTER — Telehealth: Payer: Self-pay

## 2018-10-03 NOTE — Telephone Encounter (Signed)
Pt calling; states pharmacy sent request for refill Friday for progesterone cream.  Pt calling to be sure we got it b/c last time it took a week for her to get it.  [phone]

## 2018-10-03 NOTE — Telephone Encounter (Signed)
Have not received request from pharmacy. Please advise.

## 2018-10-03 NOTE — Telephone Encounter (Signed)
Rx fax received. Pls fax back and notify pt it's done. She is due for annual. Thx.

## 2018-10-03 NOTE — Telephone Encounter (Signed)
Rx faxed and pt aware.

## 2018-10-05 ENCOUNTER — Ambulatory Visit (INDEPENDENT_AMBULATORY_CARE_PROVIDER_SITE_OTHER): Payer: 59 | Admitting: Obstetrics and Gynecology

## 2018-10-05 ENCOUNTER — Encounter: Payer: Self-pay | Admitting: Obstetrics and Gynecology

## 2018-10-05 ENCOUNTER — Other Ambulatory Visit: Payer: Self-pay

## 2018-10-05 ENCOUNTER — Other Ambulatory Visit (HOSPITAL_COMMUNITY)
Admission: RE | Admit: 2018-10-05 | Discharge: 2018-10-05 | Disposition: A | Discharge status: Home / Self Care | Payer: 59 | Source: Ambulatory Visit | Attending: Obstetrics and Gynecology | Admitting: Obstetrics and Gynecology

## 2018-10-05 VITALS — BP 104/70 | Ht 63.0 in | Wt 135.6 lb

## 2018-10-05 DIAGNOSIS — Z124 Encounter for screening for malignant neoplasm of cervix: Secondary | ICD-10-CM | POA: Insufficient documentation

## 2018-10-05 DIAGNOSIS — Z01419 Encounter for gynecological examination (general) (routine) without abnormal findings: Secondary | ICD-10-CM

## 2018-10-05 DIAGNOSIS — L700 Acne vulgaris: Secondary | ICD-10-CM | POA: Insufficient documentation

## 2018-10-05 DIAGNOSIS — Z1151 Encounter for screening for human papillomavirus (HPV): Secondary | ICD-10-CM

## 2018-10-05 DIAGNOSIS — Z1329 Encounter for screening for other suspected endocrine disorder: Secondary | ICD-10-CM

## 2018-10-05 DIAGNOSIS — Z79890 Hormone replacement therapy (postmenopausal): Secondary | ICD-10-CM

## 2018-10-05 DIAGNOSIS — Z1239 Encounter for other screening for malignant neoplasm of breast: Secondary | ICD-10-CM

## 2018-10-05 DIAGNOSIS — R635 Abnormal weight gain: Secondary | ICD-10-CM

## 2018-10-05 MED ORDER — AMBULATORY NON FORMULARY MEDICATION: 12 refills | Status: DC

## 2018-10-05 NOTE — Progress Notes (Signed)
Chief Complaint  Patient presents with  . Gynecologic Exam     HPI:      Tasha Garcia is a 43 y.o. [phone] who LMP was No LMP recorded. Patient has had an ablation., presents today for her annual examination.  Her menses are absent due to ablation. She does not have intermenstrual bleeding. No vasomotor sx.  Sex activity: single partner, contraception - vasectomy.  Last Pap: March 06, 2015  Results were: no abnormalities /neg HPV DNA   Mammogram: followed at [facility]. Done 10/19. She had a breast mass removed a few yrs ago and is still followed at [facility].  There is a FH of breast cancer in her MGM, genetic testing not indicated. There is no FH of ovarian cancer. The patient does do self-breast exams. Has occas pain at bx site RT breast. Hard to know if related to menstrual cycle since amenorrheic.   Tobacco use: The patient denies current or previous tobacco use. Alcohol use: almost daily/wknds Exercise: very active  She does not get adequate calcium and Vitamin D in her diet.  She uses prog crm 6 days on, 1 day off for acne with sx control. She needs Rx RF.  She has also noticed difficulty losing wt. She exercises regularly (although less now with covid) and hasn't changed diet. She wonders if adjusting prog dose would be helpful. Also with mood changes that are hard to "get over".    Past Medical History:  Diagnosis Date  . Abnormal uterine bleeding   . Breast mass, right 01/2013  . History of mammogram 02/06/2013; 02/08/2014   MAMM REC ADD VIEWS-ADD VIEWS REC BX-BX REC SUIRG - BENIGN;  BENIGN  . History of Papanicolaou smear of cervix 03/06/2015   -/-  . Menorrhagia   . Pneumonia, viral 09/2011   walking pneumonia/sinus inf. - took antibxs    Past Surgical History:  Procedure Laterality Date  . ABLATION  2009  . BREAST ENHANCEMENT SURGERY    . BUNIONECTOMY  2013  . CESAREAN SECTION     X3  . COMBINED HYSTEROSCOPY DIAGNOSTIC / D&C  06/25/2011    MENORRHAGIA  . ENDOMETRIAL ABLATION  06/25/2011   NOVASURE  . LAPAROSCOPY  2010   BVD  . LUMPECTOMY, RIGHT BREAST  03/23/2013   DR. GALLAGHER @ [facility]  . right breast biopsy  02/20/2013   DR. Martinique @ [facility]  . SONOHYSTEROGRAM  07/05/2007    Family History  Problem Relation Age of Onset  . Hyperlipidemia Mother   . Hypertension Mother   . Hyperlipidemia Father   . Hypertension Father   . Breast cancer Maternal Grandmother 15    Social History   Socioeconomic History  . Marital status: Married    Spouse name: Not on file  . Number of children: 3  . Years of education: Not on file  . Highest education level: Not on file  Occupational History  . Occupation: Press photographer  Social Needs  . Financial resource strain: Not on file  . Food insecurity    Worry: Not on file    Inability: Not on file  . Transportation needs    Medical: Not on file    Non-medical: Not on file  Tobacco Use  . Smoking status: Never Smoker  . Smokeless tobacco: Never Used  Substance and Sexual Activity  . Alcohol use: Yes    Comment: OCC  . Drug use: No  . Sexual activity: Yes    Birth control/protection: Surgical  Comment: Vesectomy   Lifestyle  . Physical activity    Days per week: Not on file    Minutes per session: Not on file  . Stress: Not on file  Relationships  . Social Herbalist on phone: Not on file    Gets together: Not on file    Attends religious service: Not on file    Active member of club or organization: Not on file    Attends meetings of clubs or organizations: Not on file    Relationship status: Not on file  . Intimate partner violence    Fear of current or ex partner: Not on file    Emotionally abused: Not on file    Physically abused: Not on file    Forced sexual activity: Not on file  Other Topics Concern  . Not on file  Social History Narrative  . Not on file    Current Outpatient Medications on File Prior to Visit  Medication Sig Dispense Refill  .  Hormone Cream Base (HRT CREAM BASE WOMEN) CREA     . ibuprofen (ADVIL,MOTRIN) 200 MG tablet Take 600 mg by mouth.    Marland Kitchen omeprazole (PRILOSEC) 40 MG capsule Take 40 mg by mouth every morning.  3  . rizatriptan (MAXALT) 10 MG tablet Take by mouth.     No current facility-administered medications on file prior to visit.       ROS:  Review of Systems  Constitutional: Negative for fatigue, fever and unexpected weight change.  Respiratory: Negative for cough, shortness of breath and wheezing.   Cardiovascular: Negative for chest pain, palpitations and leg swelling.  Gastrointestinal: Negative for blood in stool, constipation, diarrhea, nausea and vomiting.  Endocrine: Negative for cold intolerance, heat intolerance and polyuria.  Genitourinary: Negative for dyspareunia, dysuria, flank pain, frequency, genital sores, hematuria, menstrual problem, pelvic pain, urgency, vaginal bleeding, vaginal discharge and vaginal pain.  Musculoskeletal: Negative for back pain, joint swelling and myalgias.  Skin: Negative for rash.  Neurological: Negative for dizziness, syncope, light-headedness, numbness and headaches.  Hematological: Negative for adenopathy.  Psychiatric/Behavioral: Negative for agitation, confusion, sleep disturbance and suicidal ideas. The patient is not nervous/anxious.   BREAST: Pos for tenderness   Objective: BP 104/70   Ht 5' 3"  (1.6 m)   Wt 135 lb 9.6 oz (61.5 kg)   BMI 24.02 kg/m    Physical Exam Constitutional:      Appearance: She is well-developed.  Genitourinary:     Vulva, vagina, uterus, right adnexa and left adnexa normal.     No vulval lesion or tenderness noted.     No vaginal discharge, erythema or tenderness.     No cervical motion tenderness or polyp.     Uterus is not enlarged or tender.     No right or left adnexal mass present.     Right adnexa not tender.     Left adnexa not tender.  Neck:     Musculoskeletal: Normal range of motion.     Thyroid: No  thyromegaly.  Cardiovascular:     Rate and Rhythm: Normal rate and regular rhythm.     Heart sounds: Normal heart sounds. No murmur.  Pulmonary:     Effort: Pulmonary effort is normal.     Breath sounds: Normal breath sounds.  Chest:     Breasts:        Right: No mass, nipple discharge, skin change or tenderness.        Left: No mass,  nipple discharge, skin change or tenderness.  Abdominal:     Palpations: Abdomen is soft.     Tenderness: There is no abdominal tenderness. There is no guarding.  Musculoskeletal: Normal range of motion.  Neurological:     General: No focal deficit present.     Mental Status: She is alert and oriented to person, place, and time.     Cranial Nerves: No cranial nerve deficit.  Skin:    General: Skin is warm and dry.  Psychiatric:        Mood and Affect: Mood normal.        Behavior: Behavior normal.        Thought Content: Thought content normal.        Judgment: Judgment normal.  Vitals signs reviewed.     Assessment/Plan: Encounter for annual routine gynecological examination -   Cervical cancer screening - Plan: Cytology - PAP,   Screening for HPV (human papillomavirus) - Plan: Cytology - PAP,   Screening for breast cancer - Plan: Pt current at [facility].  Acne vulgaris - Plan: AMBULATORY NON FORMULARY MEDICATION, Cont prog crm. F/u prn.  Hormone replacement therapy (HRT) - Plan: AMBULATORY NON FORMULARY MEDICATION, Rx RF faxed to [facility].  Weight gain - Plan: TSH + free T4, Check labs. If neg, most likely related to diet.   Thyroid disorder screening - Plan: TSH + free T4,   Meds ordered this encounter  Medications  . AMBULATORY NON FORMULARY MEDICATION    Sig: Progesterone 0.5% cream Apply 1 ml topically daily 6 days on, 1 day off    Dispense:  30 mL    Refill:  12    Order Specific Question:   Supervising Provider    Answer:   Gae Dry [[id number]]             GYN counsel breast self exam, mammography screening, adequate  intake of calcium and vitamin D, diet and exercise     F/U  Return in about 1 year (around 10/05/2019).  Alicia B. Copland, PA-C 10/05/2018 3:47 PM

## 2018-10-05 NOTE — Patient Instructions (Signed)
I value your feedback and entrusting us with your care. If you get a [facility] patient survey, I would appreciate you taking the time to let us know about your experience today. Thank you! 

## 2018-10-06 LAB — TSH+FREE T4
Free T4: 0.98 ng/dL (ref 0.82–1.77)
TSH: 3.35 u[IU]/mL (ref 0.450–4.500)

## 2018-10-06 NOTE — Progress Notes (Signed)
Pt aware.

## 2018-10-06 NOTE — Progress Notes (Signed)
Pls let pt know thyroid labs normal. Try calorie changes for wt loss. F/u prn.

## 2018-10-07 LAB — CYTOLOGY - PAP
Diagnosis: NEGATIVE
HPV: NOT DETECTED

## 2019-10-10 ENCOUNTER — Ambulatory Visit: Payer: 59 | Admitting: Obstetrics and Gynecology

## 2019-10-12 ENCOUNTER — Ambulatory Visit: Payer: 59 | Admitting: Obstetrics and Gynecology

## 2019-10-30 NOTE — Progress Notes (Signed)
Chief Complaint  Patient presents with  . Gynecologic Exam     HPI:      [name] is a 44 y.o. [phone] who LMP was No LMP recorded. Patient has had an ablation., presents today for her annual examination.  Her menses are absent due to ablation. She does not have intermenstrual bleeding. No vasomotor sx.  Sex activity: single partner, contraception - vasectomy.  Last Pap: 10/05/18  Results were: no abnormalities /neg HPV DNA   Mammogram: followed at [facility]. Done 03/08/19. She had a breast mass removed a few yrs ago and is still followed at [facility].  There is a FH of breast cancer in her MGM, genetic testing not indicated. There is no FH of ovarian cancer. The patient does do self-breast exams.    Tobacco use: The patient denies current or previous tobacco use. Alcohol use: wknds No drug use Exercise: very active  She does get adequate calcium but not Vitamin D in her diet.  She uses prog crm 6 days on, 1 day off for acne with sx control. She needs Rx RF.    Past Medical History:  Diagnosis Date  . Abnormal uterine bleeding   . Acne   . Breast mass, right 01/2013  . History of mammogram 02/06/2013; 02/08/2014   MAMM REC ADD VIEWS-ADD VIEWS REC BX-BX REC SUIRG - BENIGN;  BENIGN  . History of Papanicolaou smear of cervix 03/06/2015   -/-  . Menorrhagia   . Pneumonia, viral 09/2011   walking pneumonia/sinus inf. - took antibxs    Past Surgical History:  Procedure Laterality Date  . ABLATION  2009  . BREAST ENHANCEMENT SURGERY    . BUNIONECTOMY  2013  . CESAREAN SECTION     X3  . COMBINED HYSTEROSCOPY DIAGNOSTIC / D&C  06/25/2011   MENORRHAGIA  . ENDOMETRIAL ABLATION  06/25/2011   NOVASURE  . LAPAROSCOPY  2010   BVD  . LUMPECTOMY, RIGHT BREAST  03/23/2013   DR. GALLAGHER @ [facility]  . right breast biopsy  02/20/2013   DR. [place] @ [facility]  . SONOHYSTEROGRAM  07/05/2007    Family History  Problem Relation Age of Onset  . Hyperlipidemia Mother   .  Hypertension Mother   . Hyperlipidemia Father   . Hypertension Father   . Breast cancer Maternal Grandmother 16    Social History   Socioeconomic History  . Marital status: Married    Spouse name: Not on file  . Number of children: 3  . Years of education: Not on file  . Highest education level: Not on file  Occupational History  . Occupation: Press photographer  Tobacco Use  . Smoking status: Never Smoker  . Smokeless tobacco: Never Used  Vaping Use  . Vaping Use: Never used  Substance and Sexual Activity  . Alcohol use: Yes    Comment: OCC  . Drug use: No  . Sexual activity: Yes    Birth control/protection: Surgical    Comment: Vesectomy   Other Topics Concern  . Not on file  Social History Narrative  . Not on file   Social Determinants of Health   Financial Resource Strain:   . Difficulty of Paying Living Expenses:   Food Insecurity:   . Worried About Charity fundraiser in the Last Year:   . Arboriculturist in the Last Year:   Transportation Needs:   . Film/video editor (Medical):   Marland Kitchen Lack of Transportation (Non-Medical):  Physical Activity:   . Days of Exercise per Week:   . Minutes of Exercise per Session:   Stress:   . Feeling of Stress :   Social Connections:   . Frequency of Communication with Friends and Family:   . Frequency of Social Gatherings with Friends and Family:   . Attends Religious Services:   . Active Member of Clubs or Organizations:   . Attends Archivist Meetings:   Marland Kitchen Marital Status:   Intimate Partner Violence:   . Fear of Current or Ex-Partner:   . Emotionally Abused:   Marland Kitchen Physically Abused:   . Sexually Abused:     Current Outpatient Medications on File Prior to Visit  Medication Sig Dispense Refill  . albuterol (PROVENTIL) (2.5 MG/3ML) 0.083% nebulizer solution Inhale into the lungs.    Marland Kitchen albuterol (VENTOLIN HFA) 108 (90 Base) MCG/ACT inhaler Inhale 2 puffs into the lungs every 4 (four) hours as needed.    Marland Kitchen azelastine  (ASTELIN) 0.1 % nasal spray Place into the nose.    . famotidine (PEPCID) 20 MG tablet Take by mouth.    Marland Kitchen ibuprofen (ADVIL,MOTRIN) 200 MG tablet Take 600 mg by mouth.    . Multiple Vitamin (MULTI-VITAMIN) tablet Take 1 tablet by mouth daily.    Marland Kitchen omeprazole (PRILOSEC) 40 MG capsule Take 40 mg by mouth every morning.  3  . predniSONE (DELTASONE) 10 MG tablet Take 30 mg x4 days,24mx2 days, 146m1 day ,then stop. Start medication 1 week prior to visit. (Patient not taking: Reported on 10/31/2019)     No current facility-administered medications on file prior to visit.      ROS:  Review of Systems  Constitutional: Negative for fatigue, fever and unexpected weight change.  Respiratory: Negative for cough, shortness of breath and wheezing.   Cardiovascular: Negative for chest pain, palpitations and leg swelling.  Gastrointestinal: Negative for blood in stool, constipation, diarrhea, nausea and vomiting.  Endocrine: Negative for cold intolerance, heat intolerance and polyuria.  Genitourinary: Negative for dyspareunia, dysuria, flank pain, frequency, genital sores, hematuria, menstrual problem, pelvic pain, urgency, vaginal bleeding, vaginal discharge and vaginal pain.  Musculoskeletal: Negative for back pain, joint swelling and myalgias.  Skin: Negative for rash.  Neurological: Negative for dizziness, syncope, light-headedness, numbness and headaches.  Hematological: Negative for adenopathy.  Psychiatric/Behavioral: Negative for agitation, confusion, sleep disturbance and suicidal ideas. The patient is not nervous/anxious.   BREAST: Pos for tenderness   Objective: BP 124/80   Ht 5' 3"  (1.6 m)   Wt 138 lb (62.6 kg)   BMI 24.45 kg/m    Physical Exam Constitutional:      Appearance: She is well-developed.  Genitourinary:     Vulva, vagina, uterus, right adnexa and left adnexa normal.     No vulval lesion or tenderness noted.     No vaginal discharge, erythema or tenderness.     No  cervical motion tenderness or polyp.     Uterus is not enlarged or tender.     No right or left adnexal mass present.     Right adnexa not tender.     Left adnexa not tender.  Neck:     Thyroid: No thyromegaly.  Cardiovascular:     Rate and Rhythm: Normal rate and regular rhythm.     Heart sounds: Normal heart sounds. No murmur heard.   Pulmonary:     Effort: Pulmonary effort is normal.     Breath sounds: Normal breath sounds.  Chest:  Breasts:        Right: No mass, nipple discharge, skin change or tenderness.        Left: No mass, nipple discharge, skin change or tenderness.  Abdominal:     Palpations: Abdomen is soft.     Tenderness: There is no abdominal tenderness. There is no guarding.  Musculoskeletal:        General: Normal range of motion.     Cervical back: Normal range of motion.  Neurological:     General: No focal deficit present.     Mental Status: She is alert and oriented to person, place, and time.     Cranial Nerves: No cranial nerve deficit.  Skin:    General: Skin is warm and dry.  Psychiatric:        Mood and Affect: Mood normal.        Behavior: Behavior normal.        Thought Content: Thought content normal.        Judgment: Judgment normal.  Vitals reviewed.     Assessment/Plan: Encounter for annual routine gynecological examination  Encounter for screening mammogram for malignant neoplasm of breast - Plan: MM 3D SCREEN BREAST BILATERAL; pt to sched mammo  Acne vulgaris - Plan: AMBULATORY NON FORMULARY MEDICATION; controlled with prog. Cont Rx. F/u prn.   Hormone replacement therapy (HRT) - Plan: AMBULATORY NON FORMULARY MEDICATION    Meds ordered this encounter  Medications  . AMBULATORY NON FORMULARY MEDICATION    Sig: Progesterone 0.5% cream Apply 1 ml topically daily 6 days on, 1 day off    Dispense:  30 mL    Refill:  12    Order Specific Question:   Supervising Provider    Answer:   Gae Dry [[id number]]             GYN  counsel breast self exam, mammography screening, adequate intake of calcium and vitamin D, diet and exercise     F/U  Return in about 1 year (around 10/30/2020).  Chayanne Filippi B. Munira Polson, PA-C 10/31/2019 10:59 AM

## 2019-10-30 NOTE — Patient Instructions (Signed)
I value your feedback and entrusting [place] with your care. If you get a [facility] patient survey, I would appreciate you taking the time to let us know about your experience today. Thank you!  As of March 30, 2019, your lab results will be released to your MyChart immediately, before I even have a chance to see them. Please give me time to review them and contact you if there are any abnormalities. Thank you for your patience.

## 2019-10-31 ENCOUNTER — Encounter: Payer: Self-pay | Admitting: Obstetrics and Gynecology

## 2019-10-31 ENCOUNTER — Other Ambulatory Visit: Payer: Self-pay

## 2019-10-31 ENCOUNTER — Ambulatory Visit (INDEPENDENT_AMBULATORY_CARE_PROVIDER_SITE_OTHER): Payer: 59 | Admitting: Obstetrics and Gynecology

## 2019-10-31 VITALS — BP 124/80 | Ht 63.0 in | Wt 138.0 lb

## 2019-10-31 DIAGNOSIS — Z79890 Hormone replacement therapy (postmenopausal): Secondary | ICD-10-CM | POA: Diagnosis not present

## 2019-10-31 DIAGNOSIS — L700 Acne vulgaris: Secondary | ICD-10-CM | POA: Diagnosis not present

## 2019-10-31 DIAGNOSIS — Z1231 Encounter for screening mammogram for malignant neoplasm of breast: Secondary | ICD-10-CM

## 2019-10-31 DIAGNOSIS — Z01419 Encounter for gynecological examination (general) (routine) without abnormal findings: Secondary | ICD-10-CM

## 2019-10-31 MED ORDER — AMBULATORY NON FORMULARY MEDICATION: 12 refills | Status: DC

## 2019-12-07 ENCOUNTER — Other Ambulatory Visit: Payer: Self-pay | Admitting: Internal Medicine

## 2019-12-27 ENCOUNTER — Other Ambulatory Visit: Payer: Self-pay | Admitting: Internal Medicine

## 2020-02-15 ENCOUNTER — Other Ambulatory Visit: Payer: Self-pay | Admitting: Internal Medicine

## 2020-02-15 DIAGNOSIS — R0600 Dyspnea, unspecified: Secondary | ICD-10-CM

## 2020-02-15 DIAGNOSIS — U071 COVID-19: Secondary | ICD-10-CM

## 2020-02-15 DIAGNOSIS — R0609 Other forms of dyspnea: Secondary | ICD-10-CM

## 2020-02-15 DIAGNOSIS — J1282 Pneumonia due to coronavirus disease 2019: Secondary | ICD-10-CM

## 2020-03-01 ENCOUNTER — Other Ambulatory Visit: Payer: 59

## 2020-03-19 ENCOUNTER — Other Ambulatory Visit: Payer: Self-pay

## 2020-03-19 ENCOUNTER — Ambulatory Visit
Admission: RE | Admit: 2020-03-19 | Discharge: 2020-03-19 | Disposition: A | Discharge status: Home / Self Care | Payer: 59 | Source: Ambulatory Visit | Attending: Internal Medicine | Admitting: Internal Medicine

## 2020-03-19 DIAGNOSIS — J1282 Pneumonia due to coronavirus disease 2019: Secondary | ICD-10-CM

## 2020-03-19 DIAGNOSIS — U071 COVID-19: Secondary | ICD-10-CM

## 2020-03-19 DIAGNOSIS — R0600 Dyspnea, unspecified: Secondary | ICD-10-CM

## 2020-03-19 DIAGNOSIS — R0609 Other forms of dyspnea: Secondary | ICD-10-CM

## 2020-03-19 MED ORDER — IOPAMIDOL (ISOVUE-300) INJECTION 61%
75.0000 mL | Freq: Once | INTRAVENOUS | Status: AC | PRN
  Administered 2020-03-19: 75 mL via INTRAVENOUS

## 2020-07-18 ENCOUNTER — Other Ambulatory Visit: Payer: Self-pay | Admitting: Internal Medicine

## 2020-07-18 DIAGNOSIS — R160 Hepatomegaly, not elsewhere classified: Secondary | ICD-10-CM

## 2020-07-25 ENCOUNTER — Ambulatory Visit (HOSPITAL_COMMUNITY): Payer: 59

## 2020-07-25 ENCOUNTER — Other Ambulatory Visit: Payer: Self-pay

## 2020-07-25 ENCOUNTER — Ambulatory Visit (HOSPITAL_COMMUNITY)
Admission: RE | Admit: 2020-07-25 | Discharge: 2020-07-25 | Disposition: A | Discharge status: Home / Self Care | Payer: 59 | Source: Ambulatory Visit | Attending: Internal Medicine | Admitting: Internal Medicine

## 2020-07-25 DIAGNOSIS — R160 Hepatomegaly, not elsewhere classified: Secondary | ICD-10-CM

## 2020-08-21 ENCOUNTER — Other Ambulatory Visit: Payer: Self-pay | Admitting: Internal Medicine

## 2020-08-21 ENCOUNTER — Other Ambulatory Visit (HOSPITAL_COMMUNITY): Payer: Self-pay | Admitting: Internal Medicine

## 2020-08-21 DIAGNOSIS — R911 Solitary pulmonary nodule: Secondary | ICD-10-CM

## 2020-09-05 ENCOUNTER — Ambulatory Visit: Payer: 59 | Attending: Internal Medicine

## 2020-09-10 ENCOUNTER — Other Ambulatory Visit: Payer: Self-pay | Admitting: Internal Medicine

## 2020-09-10 DIAGNOSIS — R911 Solitary pulmonary nodule: Secondary | ICD-10-CM

## 2020-09-24 ENCOUNTER — Other Ambulatory Visit: Payer: 59

## 2020-10-01 ENCOUNTER — Other Ambulatory Visit: Payer: Self-pay

## 2020-10-01 ENCOUNTER — Ambulatory Visit
Admission: RE | Admit: 2020-10-01 | Discharge: 2020-10-01 | Disposition: A | Discharge status: Home / Self Care | Payer: 59 | Source: Ambulatory Visit | Attending: Internal Medicine | Admitting: Internal Medicine

## 2020-10-01 DIAGNOSIS — R911 Solitary pulmonary nodule: Secondary | ICD-10-CM

## 2020-10-22 ENCOUNTER — Other Ambulatory Visit: Payer: Self-pay | Admitting: Obstetrics and Gynecology

## 2020-10-22 ENCOUNTER — Other Ambulatory Visit: Payer: Self-pay

## 2020-10-22 DIAGNOSIS — L700 Acne vulgaris: Secondary | ICD-10-CM

## 2020-10-22 DIAGNOSIS — Z79890 Hormone replacement therapy (postmenopausal): Secondary | ICD-10-CM

## 2020-10-22 MED ORDER — AMBULATORY NON FORMULARY MEDICATION: 12 refills | Status: DC

## 2020-10-22 MED ORDER — AMBULATORY NON FORMULARY MEDICATION: 0 refills | Status: DC

## 2020-10-22 NOTE — Telephone Encounter (Signed)
Rx RF with no RF and printed. Faxed to [employer]

## 2020-10-22 NOTE — Telephone Encounter (Signed)
Pt aware.

## 2020-10-22 NOTE — Progress Notes (Signed)
Rx RF prog till 8/22 annual

## 2020-10-22 NOTE — Telephone Encounter (Signed)
Pt calling; has scheduled appt for 12/03/20; needs refill of progesterone before then.  [phone]

## 2020-10-22 NOTE — Telephone Encounter (Signed)
It's because it is a compounded med. But it has 12 RF listed and she doesn't need any. Is it printing to your printer?

## 2020-11-18 DIAGNOSIS — Z1211 Encounter for screening for malignant neoplasm of colon: Secondary | ICD-10-CM

## 2020-11-18 HISTORY — DX: Encounter for screening for malignant neoplasm of colon: Z12.11

## 2020-12-03 ENCOUNTER — Ambulatory Visit (INDEPENDENT_AMBULATORY_CARE_PROVIDER_SITE_OTHER): Payer: 59 | Admitting: Obstetrics and Gynecology

## 2020-12-03 ENCOUNTER — Other Ambulatory Visit: Payer: Self-pay

## 2020-12-03 ENCOUNTER — Encounter: Payer: Self-pay | Admitting: Obstetrics and Gynecology

## 2020-12-03 VITALS — BP 110/80 | Ht 63.0 in | Wt 141.0 lb

## 2020-12-03 DIAGNOSIS — Z01419 Encounter for gynecological examination (general) (routine) without abnormal findings: Secondary | ICD-10-CM | POA: Diagnosis not present

## 2020-12-03 DIAGNOSIS — Z1231 Encounter for screening mammogram for malignant neoplasm of breast: Secondary | ICD-10-CM

## 2020-12-03 DIAGNOSIS — L700 Acne vulgaris: Secondary | ICD-10-CM

## 2020-12-03 DIAGNOSIS — Z79890 Hormone replacement therapy (postmenopausal): Secondary | ICD-10-CM | POA: Diagnosis not present

## 2020-12-03 DIAGNOSIS — Z1211 Encounter for screening for malignant neoplasm of colon: Secondary | ICD-10-CM

## 2020-12-03 MED ORDER — AMBULATORY NON FORMULARY MEDICATION: 7 refills | Status: DC

## 2020-12-03 NOTE — Progress Notes (Signed)
Chief Complaint  Patient presents with   Gynecologic Exam    No concerns     HPI:      [name] is a 45 y.o. [phone] who LMP was No LMP recorded. Patient has had an ablation., presents today for her annual examination.  Her menses are absent due to ablation. She does not have intermenstrual bleeding. No vasomotor sx.   Sex activity: single partner, contraception - vasectomy.  Last Pap: 10/05/18  Results were: no abnormalities /neg HPV DNA    Mammogram: followed at [facility]. Done 03/20/20. She had a breast mass removed a few yrs ago and is still followed at [facility].  There is a FH of breast cancer in her MGM, genetic testing not indicated. There is no FH of ovarian cancer. The patient does not do self-breast exams.     Tobacco use: The patient denies current or previous tobacco use. Alcohol use: wknds No drug use Exercise: very active  Colonoscopy: never   She does get adequate calcium and Vitamin D in her diet.  She uses prog crm 0.5% 6 days on, 1 day off for acne with sx control. She needs Rx RF.    Past Medical History:  Diagnosis Date   Abnormal uterine bleeding    Acne    Breast mass, right 01/2013   History of mammogram 02/06/2013; 02/08/2014   MAMM REC ADD VIEWS-ADD VIEWS REC BX-BX REC SUIRG - BENIGN;  BENIGN   History of Papanicolaou smear of cervix 03/06/2015   -/-   Menorrhagia    Pneumonia, viral 09/2011   walking pneumonia/sinus inf. - took antibxs    Past Surgical History:  Procedure Laterality Date   ABLATION  2009   BREAST ENHANCEMENT SURGERY     BUNIONECTOMY  2013   CESAREAN SECTION     X3   COMBINED HYSTEROSCOPY DIAGNOSTIC / D&C  06/25/2011   MENORRHAGIA   ENDOMETRIAL ABLATION  06/25/2011   [place]   LAPAROSCOPY  2010   BVD   LUMPECTOMY, RIGHT BREAST  03/23/2013   DR. GALLAGHER @ [facility]   right breast biopsy  02/20/2013   DR. [place] @ [facility]   SONOHYSTEROGRAM  07/05/2007    Family History  Problem Relation Age of Onset    Hyperlipidemia Mother    Hypertension Mother    Hyperlipidemia Father    Hypertension Father    Breast cancer Maternal Grandmother 57    Social History   Socioeconomic History   Marital status: Married    Spouse name: Not on file   Number of children: 3   Years of education: Not on file   Highest education level: Not on file  Occupational History   Occupation: SALES  Tobacco Use   Smoking status: Never   Smokeless tobacco: Never  Vaping Use   Vaping Use: Never used  Substance and Sexual Activity   Alcohol use: Yes    Comment: OCC   Drug use: No   Sexual activity: Yes    Birth control/protection: Surgical    Comment: Vesectomy   Other Topics Concern   Not on file  Social History Narrative   Not on file   Social Determinants of Health   Financial Resource Strain: Not on file  Food Insecurity: Not on file  Transportation Needs: Not on file  Physical Activity: Not on file  Stress: Not on file  Social Connections: Not on file  Intimate Partner Violence: Not on file    Current Outpatient Medications  on File Prior to Visit  Medication Sig Dispense Refill   albuterol (VENTOLIN HFA) 108 (90 Base) MCG/ACT inhaler Inhale 2 puffs into the lungs every 4 (four) hours as needed.     dexlansoprazole (DEXILANT) 60 MG capsule Take 1 capsule by mouth daily.     famotidine (PEPCID) 40 MG tablet Take 40 mg by mouth at bedtime.     ibuprofen (ADVIL,MOTRIN) 200 MG tablet Take 600 mg by mouth.     montelukast (SINGULAIR) 10 MG tablet Take 10 mg by mouth daily.     Multiple Vitamin (MULTI-VITAMIN) tablet Take 1 tablet by mouth daily.     No current facility-administered medications on file prior to visit.      ROS:  Review of Systems  Constitutional:  Negative for fatigue, fever and unexpected weight change.  Respiratory:  Negative for cough, shortness of breath and wheezing.   Cardiovascular:  Negative for chest pain, palpitations and leg swelling.  Gastrointestinal:   Negative for blood in stool, constipation, diarrhea, nausea and vomiting.  Endocrine: Negative for cold intolerance, heat intolerance and polyuria.  Genitourinary:  Negative for dyspareunia, dysuria, flank pain, frequency, genital sores, hematuria, menstrual problem, pelvic pain, urgency, vaginal bleeding, vaginal discharge and vaginal pain.  Musculoskeletal:  Negative for back pain, joint swelling and myalgias.  Skin:  Negative for rash.  Neurological:  Negative for dizziness, syncope, light-headedness, numbness and headaches.  Hematological:  Negative for adenopathy.  Psychiatric/Behavioral:  Negative for agitation, confusion, sleep disturbance and suicidal ideas. The patient is not nervous/anxious.  BREAST: Pos for tenderness   Objective: BP 110/80   Ht 5' 3"  (1.6 m)   Wt 141 lb (64 kg)   BMI 24.98 kg/m    Physical Exam Constitutional:      Appearance: She is well-developed.  Genitourinary:     Vulva normal.     Right Labia: No rash, tenderness or lesions.    Left Labia: No tenderness, lesions or rash.    No vaginal discharge, erythema or tenderness.      Right Adnexa: not tender and no mass present.    Left Adnexa: not tender and no mass present.    No cervical motion tenderness, friability or polyp.     Uterus is not enlarged or tender.  Breasts:    Right: No mass, nipple discharge, skin change or tenderness.     Left: No mass, nipple discharge, skin change or tenderness.  Neck:     Thyroid: No thyromegaly.  Cardiovascular:     Rate and Rhythm: Normal rate and regular rhythm.     Heart sounds: Normal heart sounds. No murmur heard. Pulmonary:     Effort: Pulmonary effort is normal.     Breath sounds: Normal breath sounds.  Abdominal:     Palpations: Abdomen is soft.     Tenderness: There is no abdominal tenderness. There is no guarding or rebound.  Musculoskeletal:        General: Normal range of motion.     Cervical back: Normal range of motion.  Lymphadenopathy:      Cervical: No cervical adenopathy.  Neurological:     General: No focal deficit present.     Mental Status: She is alert and oriented to person, place, and time.     Cranial Nerves: No cranial nerve deficit.  Skin:    General: Skin is warm and dry.  Psychiatric:        Mood and Affect: Mood normal.  Behavior: Behavior normal.        Thought Content: Thought content normal.        Judgment: Judgment normal.  Vitals reviewed.    Assessment/Plan: Encounter for annual routine gynecological examination  Encounter for screening mammogram for malignant neoplasm of breast--pt current on mammo, followed at [facility] for colon cancer - Plan: Cologuard; colonoscopy/cologuard discussed. Pt elects cologuard. Ref sent. Will f/u with results.  Hormone replacement therapy (HRT) - Plan: AMBULATORY NON FORMULARY MEDICATION  Acne vulgaris - Plan: AMBULATORY NON FORMULARY MEDICATION; Rx RF prog. F/u prn.    Meds ordered this encounter  Medications   AMBULATORY NON FORMULARY MEDICATION    Sig: Progesterone 0.5% cream Apply 1 ml topically daily 6 days on, 1 day off    Dispense:  30 mL    Refill:  7    Order Specific Question:   Supervising Provider    Answer:   Gae Dry [[id number]]              GYN counsel breast self exam, mammography screening, adequate intake of calcium and vitamin D, diet and exercise     F/U  Return in about 1 year (around 12/03/2021).  Amerie Beaumont B. Sanvika Cuttino, PA-C 12/03/2020 2:24 PM

## 2020-12-17 ENCOUNTER — Encounter: Payer: Self-pay | Admitting: Obstetrics and Gynecology

## 2020-12-17 LAB — COLOGUARD: Cologuard: NEGATIVE

## 2021-12-03 NOTE — Progress Notes (Unsigned)
No chief complaint on file.    HPI:      [name] is a 46 y.o. [phone] who LMP was No LMP recorded. Patient has had an ablation., presents today for her annual examination.  Her menses are absent due to ablation. She does not have intermenstrual bleeding. No vasomotor sx.   Sex activity: single partner, contraception - vasectomy.  Last Pap: 10/05/18  Results were: no abnormalities /neg HPV DNA    Mammogram: followed at [facility]. Done 03/24/21. She had a breast mass removed a few yrs ago and is still followed at [facility].  There is a FH of breast cancer in her MGM, genetic testing not indicated. There is no FH of ovarian cancer. The patient does not do self-breast exams.     Tobacco use: The patient denies current or previous tobacco use. Alcohol use: wknds No drug use Exercise: very active  Colonoscopy: never; NEG Cologuard 8/22, repeat due after 3 yrs   She does get adequate calcium and Vitamin D in her diet.  She uses prog crm 0.5% 6 days on, 1 day off for acne with sx control. She needs Rx RF.    Past Medical History:  Diagnosis Date   Abnormal uterine bleeding    Acne    Breast mass, right 01/2013   History of mammogram 02/06/2013; 02/08/2014   MAMM REC ADD VIEWS-ADD VIEWS REC BX-BX REC SUIRG - BENIGN;  BENIGN   History of Papanicolaou smear of cervix 03/06/2015   -/-   Menorrhagia    Pneumonia, viral 09/2011   walking pneumonia/sinus inf. - took antibxs   Screening for colon cancer 11/2020   NEG Cologuard; repeat after 3 yrs    Past Surgical History:  Procedure Laterality Date   ABLATION  2009   BREAST ENHANCEMENT SURGERY     BUNIONECTOMY  2013   CESAREAN SECTION     X3   COMBINED HYSTEROSCOPY DIAGNOSTIC / D&C  06/25/2011   MENORRHAGIA   ENDOMETRIAL ABLATION  06/25/2011   NOVASURE   LAPAROSCOPY  2010   BVD   LUMPECTOMY, RIGHT BREAST  03/23/2013   DR. GALLAGHER @ [facility]   right breast biopsy  02/20/2013   DR. [place] @ [facility]   SONOHYSTEROGRAM  07/05/2007     Family History  Problem Relation Age of Onset   Hyperlipidemia Mother    Hypertension Mother    Hyperlipidemia Father    Hypertension Father    Breast cancer Maternal Grandmother 46    Social History   Socioeconomic History   Marital status: Married    Spouse name: Not on file   Number of children: 3   Years of education: Not on file   Highest education level: Not on file  Occupational History   Occupation: SALES  Tobacco Use   Smoking status: Never   Smokeless tobacco: Never  Vaping Use   Vaping Use: Never used  Substance and Sexual Activity   Alcohol use: Yes    Comment: OCC   Drug use: No   Sexual activity: Yes    Birth control/protection: Surgical    Comment: Vesectomy   Other Topics Concern   Not on file  Social History Narrative   Not on file   Social Determinants of Health   Financial Resource Strain: Not on file  Food Insecurity: Not on file  Transportation Needs: Not on file  Physical Activity: Not on file  Stress: Not on file  Social Connections: Not on file  Intimate Partner Violence: Not on file    Current Outpatient Medications on File Prior to Visit  Medication Sig Dispense Refill   albuterol (VENTOLIN HFA) 108 (90 Base) MCG/ACT inhaler Inhale 2 puffs into the lungs every 4 (four) hours as needed.     AMBULATORY NON FORMULARY MEDICATION Progesterone 0.5% cream Apply 1 ml topically daily 6 days on, 1 day off 30 mL 7   dexlansoprazole (DEXILANT) 60 MG capsule Take 1 capsule by mouth daily.     famotidine (PEPCID) 40 MG tablet Take 40 mg by mouth at bedtime.     ibuprofen (ADVIL,MOTRIN) 200 MG tablet Take 600 mg by mouth.     montelukast (SINGULAIR) 10 MG tablet Take 10 mg by mouth daily.     Multiple Vitamin (MULTI-VITAMIN) tablet Take 1 tablet by mouth daily.     No current facility-administered medications on file prior to visit.      ROS:  Review of Systems  Constitutional:  Negative for fatigue, fever and unexpected weight  change.  Respiratory:  Negative for cough, shortness of breath and wheezing.   Cardiovascular:  Negative for chest pain, palpitations and leg swelling.  Gastrointestinal:  Negative for blood in stool, constipation, diarrhea, nausea and vomiting.  Endocrine: Negative for cold intolerance, heat intolerance and polyuria.  Genitourinary:  Negative for dyspareunia, dysuria, flank pain, frequency, genital sores, hematuria, menstrual problem, pelvic pain, urgency, vaginal bleeding, vaginal discharge and vaginal pain.  Musculoskeletal:  Negative for back pain, joint swelling and myalgias.  Skin:  Negative for rash.  Neurological:  Negative for dizziness, syncope, light-headedness, numbness and headaches.  Hematological:  Negative for adenopathy.  Psychiatric/Behavioral:  Negative for agitation, confusion, sleep disturbance and suicidal ideas. The patient is not nervous/anxious.   BREAST: Pos for tenderness   Objective: There were no vitals taken for this visit.   Physical Exam Constitutional:      Appearance: She is well-developed.  Genitourinary:     Vulva normal.     Right Labia: No rash, tenderness or lesions.    Left Labia: No tenderness, lesions or rash.    No vaginal discharge, erythema or tenderness.      Right Adnexa: not tender and no mass present.    Left Adnexa: not tender and no mass present.    No cervical motion tenderness, friability or polyp.     Uterus is not enlarged or tender.  Breasts:    Right: No mass, nipple discharge, skin change or tenderness.     Left: No mass, nipple discharge, skin change or tenderness.  Neck:     Thyroid: No thyromegaly.  Cardiovascular:     Rate and Rhythm: Normal rate and regular rhythm.     Heart sounds: Normal heart sounds. No murmur heard. Pulmonary:     Effort: Pulmonary effort is normal.     Breath sounds: Normal breath sounds.  Abdominal:     Palpations: Abdomen is soft.     Tenderness: There is no abdominal tenderness. There  is no guarding or rebound.  Musculoskeletal:        General: Normal range of motion.     Cervical back: Normal range of motion.  Lymphadenopathy:     Cervical: No cervical adenopathy.  Neurological:     General: No focal deficit present.     Mental Status: She is alert and oriented to person, place, and time.     Cranial Nerves: No cranial nerve deficit.  Skin:    General: Skin is warm and  dry.  Psychiatric:        Mood and Affect: Mood normal.        Behavior: Behavior normal.        Thought Content: Thought content normal.        Judgment: Judgment normal.  Vitals reviewed.     Assessment/Plan: Encounter for annual routine gynecological examination  Encounter for screening mammogram for malignant neoplasm of breast--pt current on mammo, followed at [facility]  Screening for colon cancer - Plan: Cologuard; colonoscopy/cologuard discussed. Pt elects cologuard. Ref sent. Will f/u with results.  Hormone replacement therapy (HRT) - Plan: AMBULATORY NON FORMULARY MEDICATION  Acne vulgaris - Plan: AMBULATORY NON FORMULARY MEDICATION; Rx RF prog. F/u prn.    No orders of the defined types were placed in this encounter.             GYN counsel breast self exam, mammography screening, adequate intake of calcium and vitamin D, diet and exercise     F/U  No follow-ups on file.  Maximilien Hayashi B. Jenniger Figiel, PA-C 12/03/2021 9:25 PM

## 2021-12-04 ENCOUNTER — Other Ambulatory Visit (HOSPITAL_COMMUNITY)
Admission: RE | Admit: 2021-12-04 | Discharge: 2021-12-04 | Disposition: A | Discharge status: Home / Self Care | Payer: Commercial Managed Care - PPO | Source: Ambulatory Visit | Attending: Obstetrics and Gynecology | Admitting: Obstetrics and Gynecology

## 2021-12-04 ENCOUNTER — Ambulatory Visit (INDEPENDENT_AMBULATORY_CARE_PROVIDER_SITE_OTHER): Payer: Commercial Managed Care - PPO | Admitting: Obstetrics and Gynecology

## 2021-12-04 ENCOUNTER — Encounter: Payer: Self-pay | Admitting: Obstetrics and Gynecology

## 2021-12-04 VITALS — BP 100/60 | Ht 63.0 in | Wt 138.0 lb

## 2021-12-04 DIAGNOSIS — Z1231 Encounter for screening mammogram for malignant neoplasm of breast: Secondary | ICD-10-CM

## 2021-12-04 DIAGNOSIS — Z1151 Encounter for screening for human papillomavirus (HPV): Secondary | ICD-10-CM | POA: Insufficient documentation

## 2021-12-04 DIAGNOSIS — Z01419 Encounter for gynecological examination (general) (routine) without abnormal findings: Secondary | ICD-10-CM | POA: Diagnosis not present

## 2021-12-04 DIAGNOSIS — Z124 Encounter for screening for malignant neoplasm of cervix: Secondary | ICD-10-CM

## 2021-12-04 DIAGNOSIS — Z79890 Hormone replacement therapy (postmenopausal): Secondary | ICD-10-CM

## 2021-12-04 DIAGNOSIS — L700 Acne vulgaris: Secondary | ICD-10-CM

## 2021-12-04 NOTE — Patient Instructions (Signed)
I value your feedback and you entrusting us with your care. If you get a [facility] patient survey, I would appreciate you taking the time to let us know about your experience today. Thank you! ? ? ?

## 2021-12-09 LAB — CYTOLOGY - PAP
Adequacy: ABSENT
Comment: NEGATIVE
Diagnosis: NEGATIVE
Diagnosis: REACTIVE
High risk HPV: NEGATIVE

## 2022-11-20 ENCOUNTER — Other Ambulatory Visit: Payer: Self-pay | Admitting: Internal Medicine

## 2022-11-20 DIAGNOSIS — J849 Interstitial pulmonary disease, unspecified: Secondary | ICD-10-CM

## 2022-11-20 DIAGNOSIS — J4540 Moderate persistent asthma, uncomplicated: Secondary | ICD-10-CM

## 2022-12-01 ENCOUNTER — Ambulatory Visit
Admission: RE | Admit: 2022-12-01 | Discharge: 2022-12-01 | Disposition: A | Discharge status: Home / Self Care | Payer: Commercial Managed Care - PPO | Source: Ambulatory Visit | Attending: Internal Medicine | Admitting: Internal Medicine

## 2022-12-01 DIAGNOSIS — J849 Interstitial pulmonary disease, unspecified: Secondary | ICD-10-CM | POA: Insufficient documentation

## 2022-12-01 DIAGNOSIS — J4540 Moderate persistent asthma, uncomplicated: Secondary | ICD-10-CM | POA: Diagnosis not present

## 2022-12-01 MED ORDER — IOHEXOL 300 MG/ML  SOLN
100.0000 mL | Freq: Once | INTRAMUSCULAR | Status: AC | PRN
  Administered 2022-12-01: 100 mL via INTRAVENOUS

## 2022-12-14 ENCOUNTER — Other Ambulatory Visit: Payer: Self-pay | Admitting: Medical Genetics

## 2022-12-14 DIAGNOSIS — Z006 Encounter for examination for normal comparison and control in clinical research program: Secondary | ICD-10-CM

## 2022-12-17 ENCOUNTER — Ambulatory Visit: Payer: Commercial Managed Care - PPO | Admitting: Obstetrics and Gynecology

## 2023-01-01 IMAGING — US US ABDOMEN LIMITED RUQ/ASCITES
1 series · 14 of 25 positions shown · non-contrast
Comparison: 03/19/2020 chest CT

CLINICAL DATA: Suspected hepatic steatosis on CT.

EXAM:
ULTRASOUND ABDOMEN LIMITED RIGHT UPPER QUADRANT

[Series 1: us abdomen limited ruq (liver/gb) · 14 of 57 slices shown]
[im 1/57]
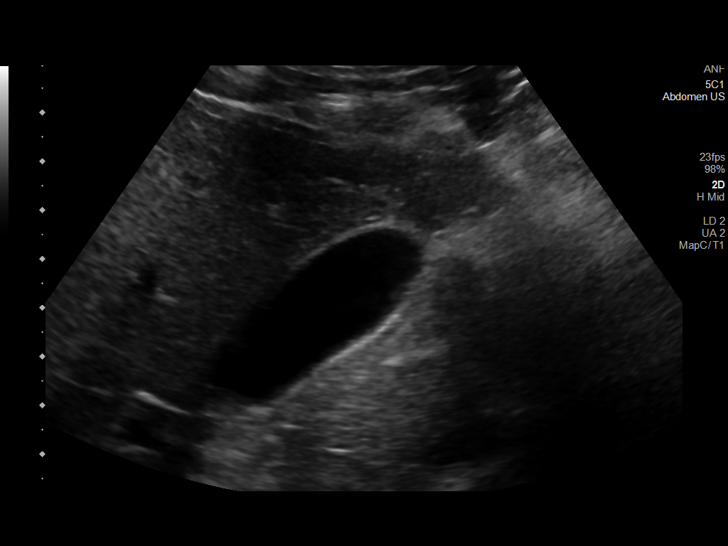
[im 5/57]
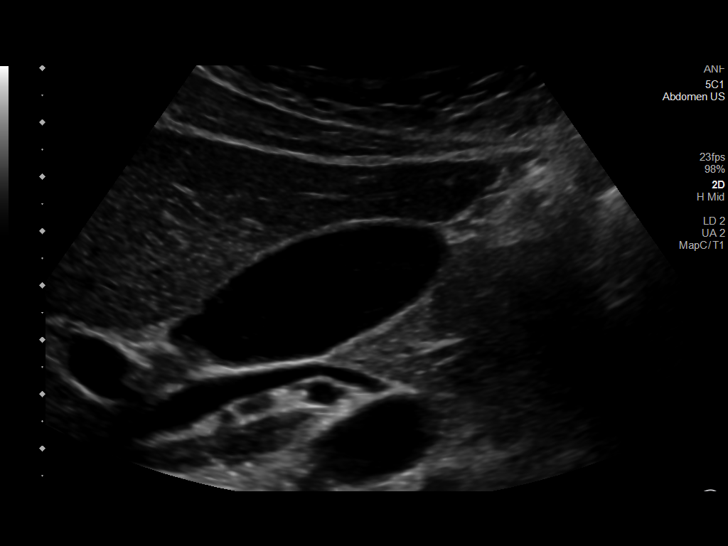
[im 10/57]
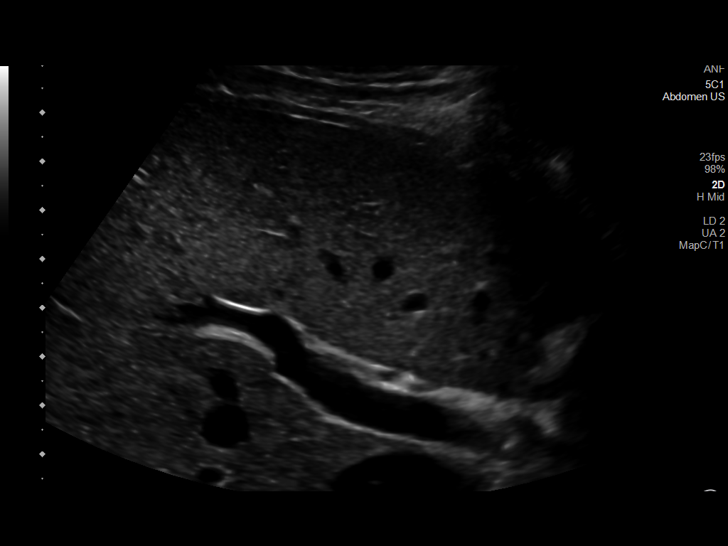
[im 15/57]
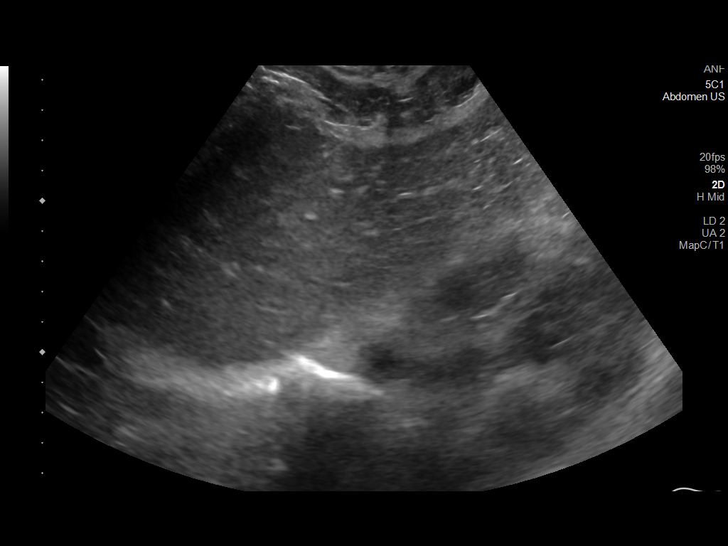
[im 19/57]
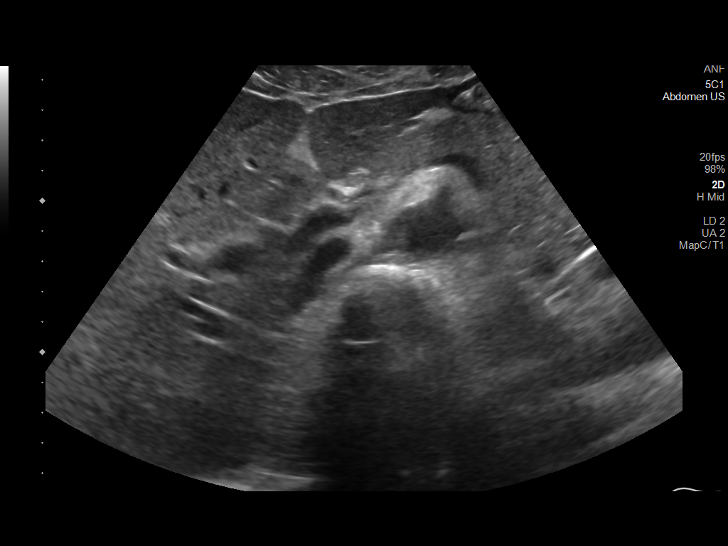
[im 22/57]
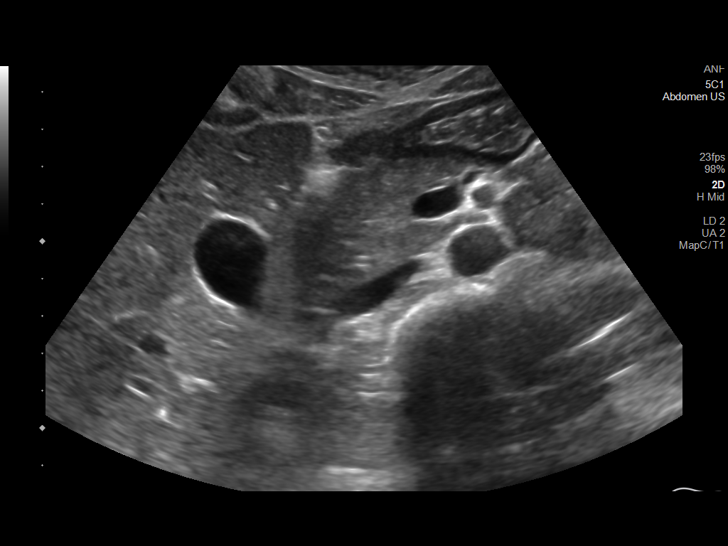
[im 26/57]
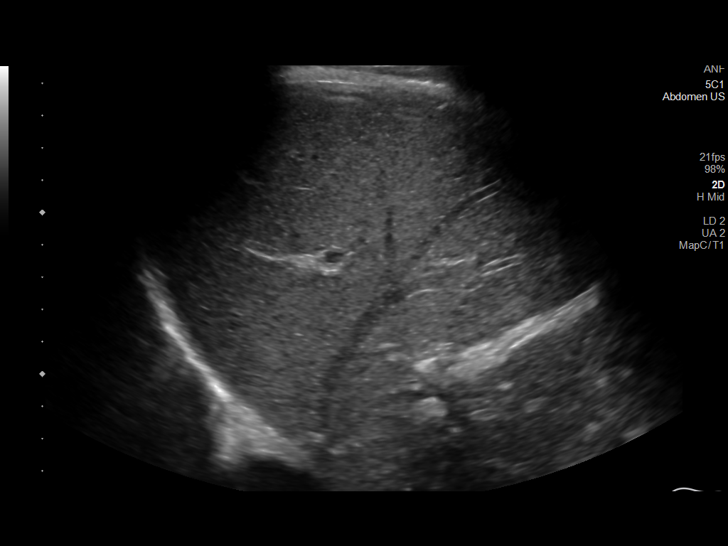
[im 31/57]
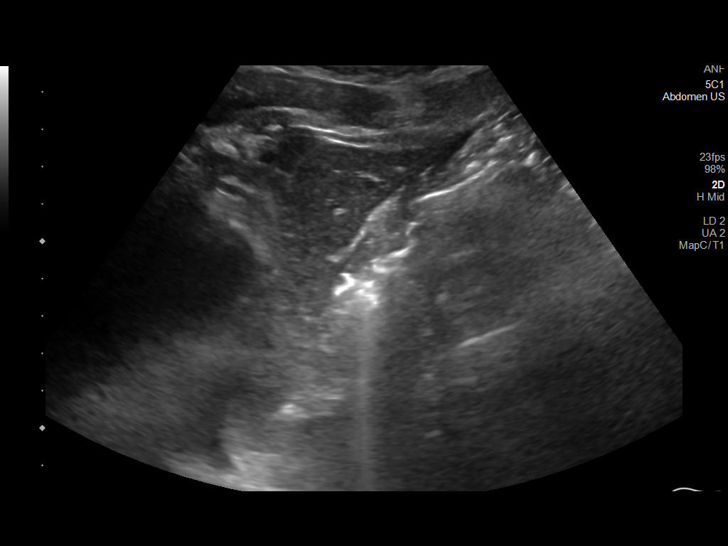
[im 36/57]
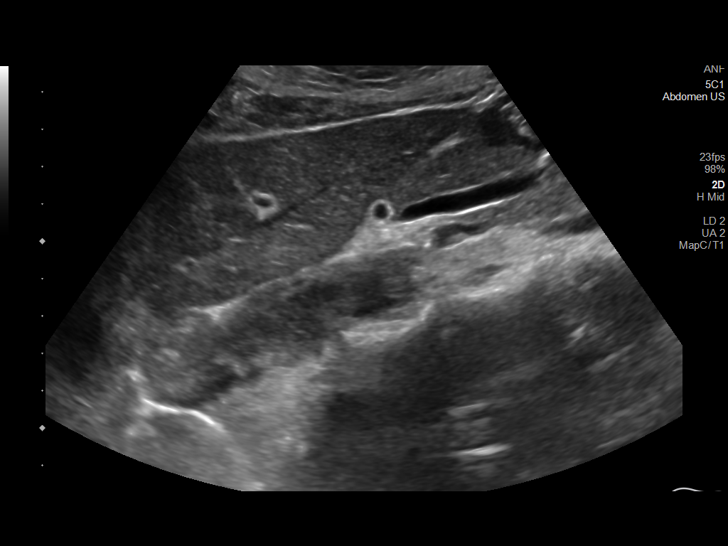
[im 38/57]
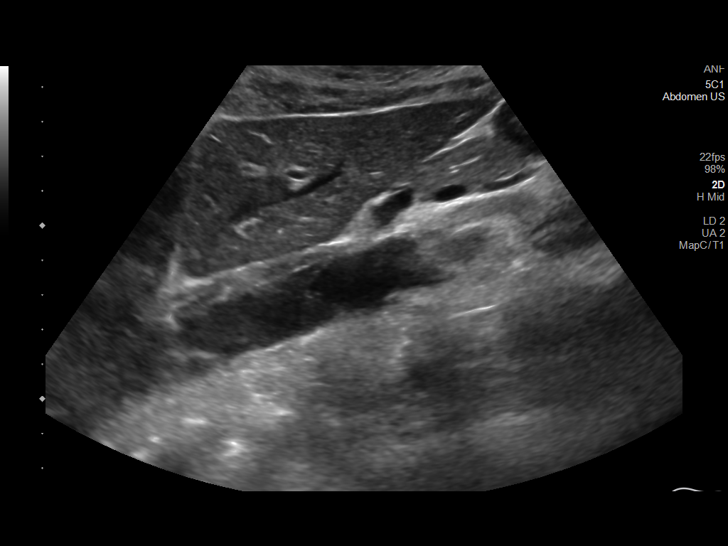
[im 43/57]
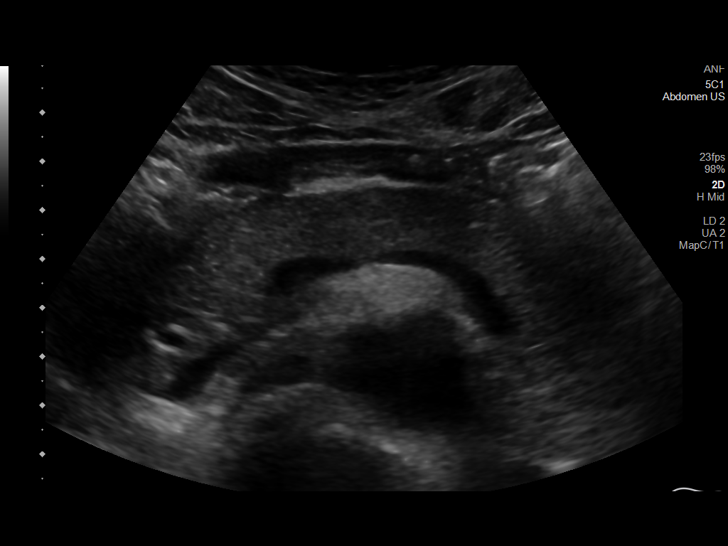
[im 47/57]
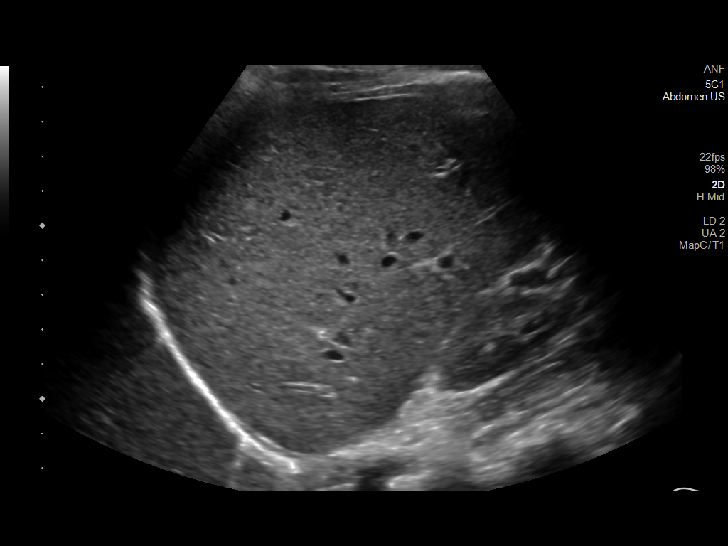
[im 52/57]
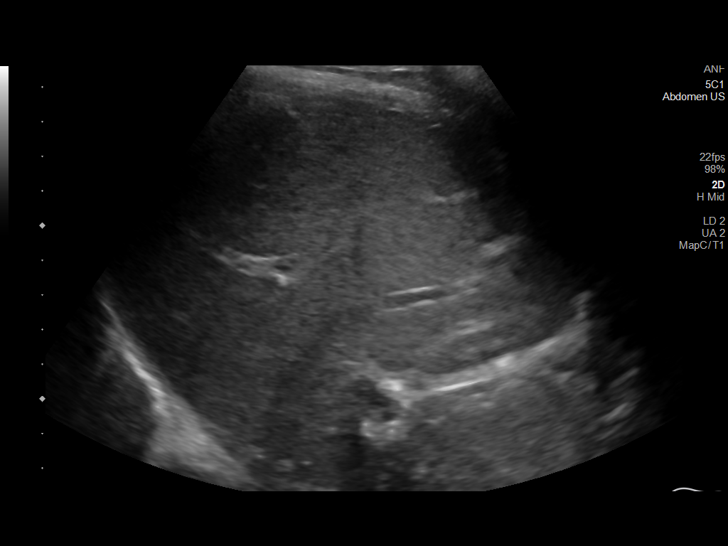
[im 57/57]
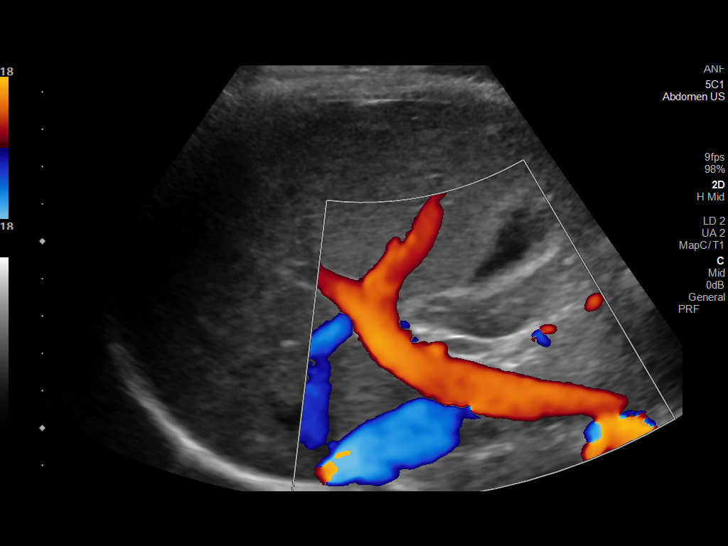

[14 of 25 positions shown; findings below may reference images not displayed]

FINDINGS: Gallbladder:

No gallstones or wall thickening visualized. No sonographic Murphy
sign noted by sonographer.

Common bile duct:

Diameter: 5 mm

Liver:

Normal liver size. No focal lesion identified. Within normal limits
in parenchymal echogenicity. Portal vein is patent on color Doppler
imaging with normal direction of blood flow towards the liver.

Other: None.
IMPRESSION: Normal right upper quadrant abdominal sonogram. No evidence of
hepatic steatosis. Normal liver size.

## 2023-01-21 NOTE — Progress Notes (Signed)
Chief Complaint  Patient presents with   Gynecologic Exam    No concerns     HPI:      Tasha Garcia is a 47 y.o. [id number] who LMP was No LMP recorded. Patient has had an ablation., presents today for her annual examination.  Her menses are absent due to ablation. She does not have intermenstrual bleeding. No vasomotor sx. On prog 250 mg caps for acne. Does in [place], has yearly labs.    Sex activity: single partner, contraception - vasectomy. No pain/bleeding. Does have some vag dryness occas, improved with lubricants. Last Pap: 12/04/21  Results were: no abnormalities /neg HPV DNA    Mammogram: followed at [facility]. Done 04/29/22. She had a breast mass removed a few yrs ago and is still followed at [facility]. Has implants bilat. There is a FH of breast cancer in her MGM, genetic testing not indicated. There is no FH of ovarian cancer. The patient does not do self-breast exams.     Tobacco use: The patient denies current or previous tobacco use. Alcohol use: wknds No drug use Exercise: very active  Colonoscopy: never; NEG Cologuard 8/22, repeat due after 3 yrs   She does get adequate calcium and Vitamin D in her diet.   Past Medical History:  Diagnosis Date   Abnormal uterine bleeding    Acne    Breast mass, right 01/2013   History of mammogram 02/06/2013; 02/08/2014   MAMM REC ADD VIEWS-ADD VIEWS REC BX-BX REC SUIRG - BENIGN;  BENIGN   History of Papanicolaou smear of cervix 03/06/2015   -/-   Menorrhagia    Pneumonia, viral 09/2011   walking pneumonia/sinus inf. - took antibxs   Screening for colon cancer 11/2020   NEG Cologuard; repeat after 3 yrs    Past Surgical History:  Procedure Laterality Date   ABLATION  2009   BREAST ENHANCEMENT SURGERY     BUNIONECTOMY  2013   CESAREAN SECTION     X3   COMBINED HYSTEROSCOPY DIAGNOSTIC / D&C  06/25/2011   MENORRHAGIA   ENDOMETRIAL ABLATION  06/25/2011   NOVASURE   LAPAROSCOPY  2010   BVD   LUMPECTOMY, RIGHT BREAST   03/23/2013   DR. GALLAGHER @ [facility]   right breast biopsy  02/20/2013   DR. [place] @ [facility]   SONOHYSTEROGRAM  07/05/2007    Family History  Problem Relation Age of Onset   Hyperlipidemia Mother    Hypertension Mother    Hyperlipidemia Father    Hypertension Father    Breast cancer Maternal Grandmother 66    Social History   Socioeconomic History   Marital status: Married    Spouse name: Not on file   Number of children: 3   Years of education: Not on file   Highest education level: Not on file  Occupational History   Occupation: SALES  Tobacco Use   Smoking status: Never   Smokeless tobacco: Never  Vaping Use   Vaping status: Never Used  Substance and Sexual Activity   Alcohol use: Yes    Comment: OCC   Drug use: No   Sexual activity: Yes    Birth control/protection: Surgical    Comment: Vesectomy   Other Topics Concern   Not on file  Social History Narrative   Not on file   Social Determinants of Health   Financial Resource Strain: Low Risk  (01/04/2023)   Received from [facility]   Overall Financial Resource Strain (  CARDIA)    Difficulty of Paying Living Expenses: Not hard at all  Food Insecurity: No Food Insecurity (01/04/2023)   Received from [facility]   Hunger Vital Sign    Worried About Running Out of Food in the Last Year: Never true    Ran Out of Food in the Last Year: Never true  Transportation Needs: No Transportation Needs (01/04/2023)   Received from [facility] - Transportation    In the past 12 months, has lack of transportation kept you from medical appointments or from getting medications?: No    Lack of Transportation (Non-Medical): No  Physical Activity: Not on file  Stress: Not on file  Social Connections: Not on file  Intimate Partner Violence: Not on file    Current Outpatient Medications on File Prior to Visit  Medication Sig Dispense Refill   albuterol (VENTOLIN HFA)  108 (90 Base) MCG/ACT inhaler Inhale 2 puffs into the lungs every 4 (four) hours as needed.     AMBULATORY NON FORMULARY MEDICATION Medication Name: Progesterone 250 mg caps Take 1 QHS     BREZTRI AEROSPHERE 160-9-4.8 MCG/ACT AERO Inhale into the lungs.     dexlansoprazole (DEXILANT) 60 MG capsule Take 1 capsule by mouth daily.     escitalopram (LEXAPRO) 10 MG tablet Take 10 mg by mouth daily.     famotidine (PEPCID) 40 MG tablet Take 40 mg by mouth at bedtime.     levocetirizine (XYZAL) 5 MG tablet Take by mouth.     meloxicam (MOBIC) 15 MG tablet Take 15 mg by mouth daily.     montelukast (SINGULAIR) 10 MG tablet Take 10 mg by mouth daily.     Multiple Vitamin (MULTI-VITAMIN) tablet Take 1 tablet by mouth daily.     No current facility-administered medications on file prior to visit.      ROS:  Review of Systems  Constitutional:  Negative for fatigue, fever and unexpected weight change.  Respiratory:  Negative for cough, shortness of breath and wheezing.   Cardiovascular:  Negative for chest pain, palpitations and leg swelling.  Gastrointestinal:  Negative for blood in stool, constipation, diarrhea, nausea and vomiting.  Endocrine: Negative for cold intolerance, heat intolerance and polyuria.  Genitourinary:  Negative for dyspareunia, dysuria, flank pain, frequency, genital sores, hematuria, menstrual problem, pelvic pain, urgency, vaginal bleeding, vaginal discharge and vaginal pain.  Musculoskeletal:  Negative for back pain, joint swelling and myalgias.  Skin:  Negative for rash.  Neurological:  Negative for dizziness, syncope, light-headedness, numbness and headaches.  Hematological:  Negative for adenopathy.  Psychiatric/Behavioral:  Negative for agitation, confusion, sleep disturbance and suicidal ideas. The patient is not nervous/anxious.   BREAST: Pos for tenderness   Objective: BP (!) 128/91   Pulse 87   Ht 5\' 2"  (1.575 m)   Wt 138 lb (62.6 kg)   BMI 25.24 kg/m     Physical Exam Constitutional:      Appearance: She is well-developed.  Genitourinary:     Vulva normal.     Right Labia: No rash, tenderness or lesions.    Left Labia: No tenderness, lesions or rash.    No vaginal discharge, erythema or tenderness.      Right Adnexa: not tender and no mass present.    Left Adnexa: not tender and no mass present.    No cervical motion tenderness, friability or polyp.     Uterus is not enlarged or tender.  Breasts:  Right: No mass, nipple discharge, skin change or tenderness.     Left: No mass, nipple discharge, skin change or tenderness.  Neck:     Thyroid: No thyromegaly.  Cardiovascular:     Rate and Rhythm: Normal rate and regular rhythm.     Heart sounds: Normal heart sounds. No murmur heard. Pulmonary:     Effort: Pulmonary effort is normal.     Breath sounds: Normal breath sounds.  Abdominal:     Palpations: Abdomen is soft.     Tenderness: There is no abdominal tenderness. There is no guarding or rebound.  Musculoskeletal:        General: Normal range of motion.     Cervical back: Normal range of motion.  Lymphadenopathy:     Cervical: No cervical adenopathy.  Neurological:     General: No focal deficit present.     Mental Status: She is alert and oriented to person, place, and time.     Cranial Nerves: No cranial nerve deficit.  Skin:    General: Skin is warm and dry.  Psychiatric:        Mood and Affect: Mood normal.        Behavior: Behavior normal.        Thought Content: Thought content normal.        Judgment: Judgment normal.  Vitals reviewed.     Assessment/Plan: Encounter for annual routine gynecological examination  Encounter for screening mammogram for malignant neoplasm of breast; does mammos [facility]  Hormone replacement therapy (HRT)--gets Rx prog caps in [place]. F/u prn.        GYN counsel breast self exam, mammography screening, adequate intake of calcium and vitamin D, diet and exercise      F/U  Return in about 1 year (around 01/25/2024).  Kryssa Risenhoover B. Davione Lenker, PA-C 01/25/2023 10:20 AM

## 2023-01-25 ENCOUNTER — Encounter: Payer: Self-pay | Admitting: Obstetrics and Gynecology

## 2023-01-25 ENCOUNTER — Ambulatory Visit: Payer: Commercial Managed Care - PPO | Admitting: Obstetrics and Gynecology

## 2023-01-25 VITALS — BP 128/91 | HR 87 | Ht 62.0 in | Wt 138.0 lb

## 2023-01-25 DIAGNOSIS — L700 Acne vulgaris: Secondary | ICD-10-CM

## 2023-01-25 DIAGNOSIS — Z01419 Encounter for gynecological examination (general) (routine) without abnormal findings: Secondary | ICD-10-CM

## 2023-01-25 DIAGNOSIS — Z1231 Encounter for screening mammogram for malignant neoplasm of breast: Secondary | ICD-10-CM

## 2023-01-25 DIAGNOSIS — Z79890 Hormone replacement therapy (postmenopausal): Secondary | ICD-10-CM

## 2023-01-25 NOTE — Patient Instructions (Signed)
I value your feedback and you entrusting us with your care. If you get a [facility] patient survey, I would appreciate you taking the time to let us know about your experience today. Thank you! ? ? ?

## 2023-02-09 ENCOUNTER — Other Ambulatory Visit: Payer: Commercial Managed Care - PPO

## 2023-02-10 ENCOUNTER — Other Ambulatory Visit
Admission: RE | Admit: 2023-02-10 | Discharge: 2023-02-10 | Disposition: A | Discharge status: Home / Self Care | Payer: Commercial Managed Care - PPO | Source: Ambulatory Visit | Attending: Medical Genetics | Admitting: Medical Genetics

## 2023-02-10 DIAGNOSIS — Z006 Encounter for examination for normal comparison and control in clinical research program: Secondary | ICD-10-CM | POA: Insufficient documentation

## 2023-02-22 LAB — HELIX MOLECULAR SCREEN: Genetic Analysis Overall Interpretation: NEGATIVE

## 2023-02-22 LAB — GENECONNECT MOLECULAR SCREEN

## 2023-03-10 IMAGING — CT CT CHEST W/O CM
1 series · 16 of 34 positions shown, 20 images · non-contrast
Comparison: CT chest 03/19/2020.

CLINICAL DATA: History of pulmonary nodule.

EXAM:
CT CHEST WITHOUT CONTRAST
TECHNIQUE: Multidetector CT imaging of the chest was performed following the
standard protocol without IV contrast.

[Series 2: chest w/(date) · axial · 0.71mm/px · z∈[-305,-9]mm · 16 of 168 slices shown, 20 images]
[im 13/168  mediastinal]
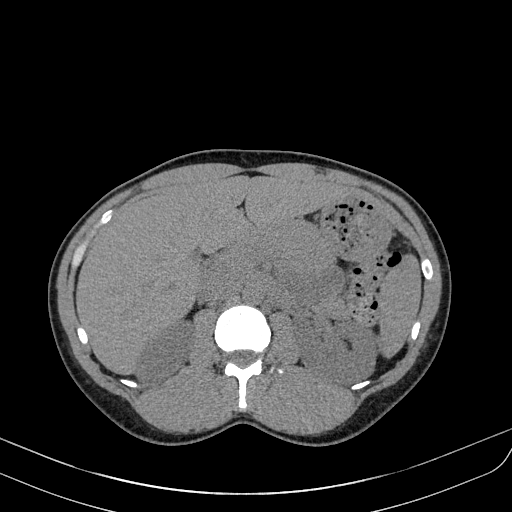
[im 13/168  lung]
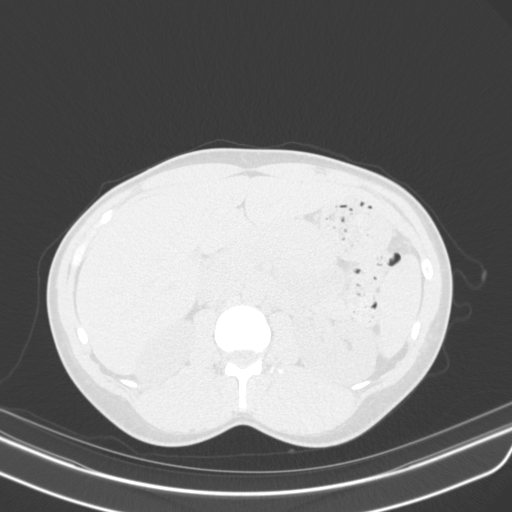
[im 25/168  lung]
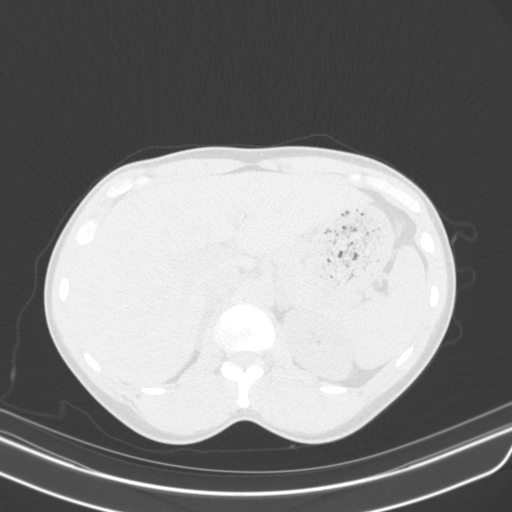
[im 34/168  lung]
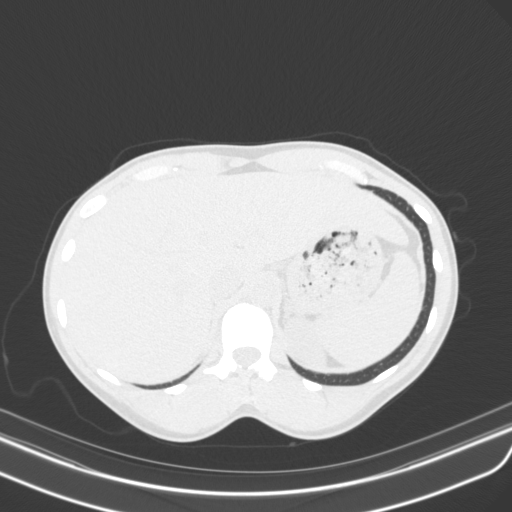
[im 44/168  lung]
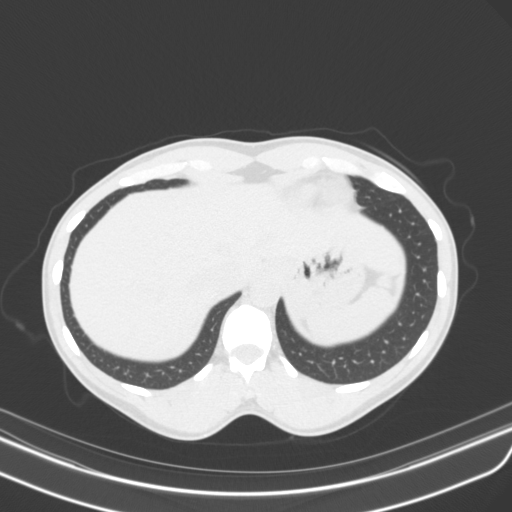
[im 56/168  mediastinal]
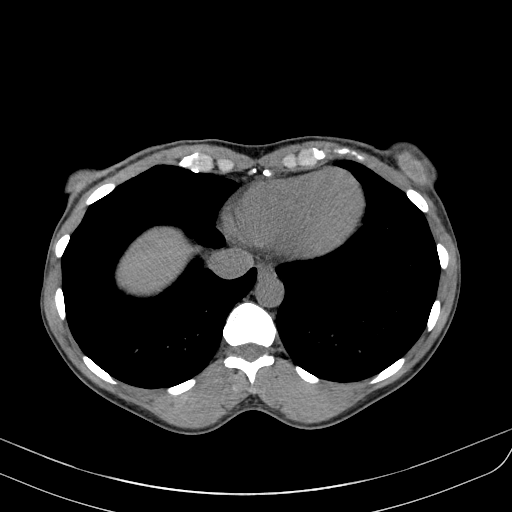
[im 56/168  lung]
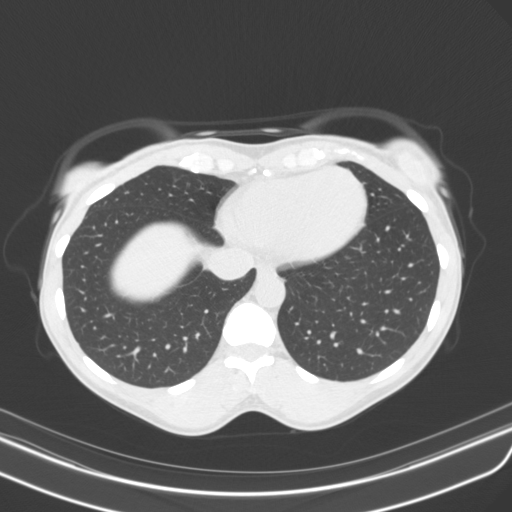
[im 67/168  lung]
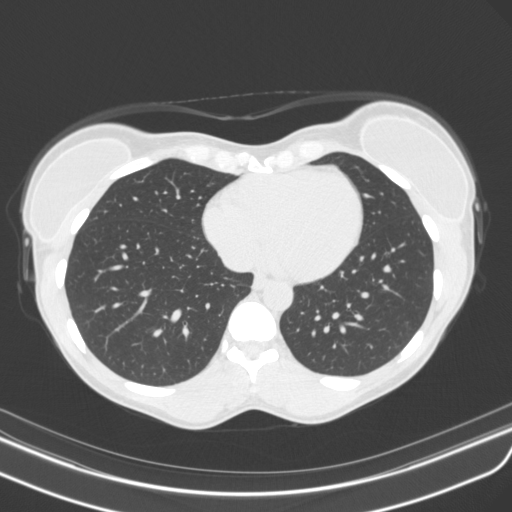
[im 75/168  lung]
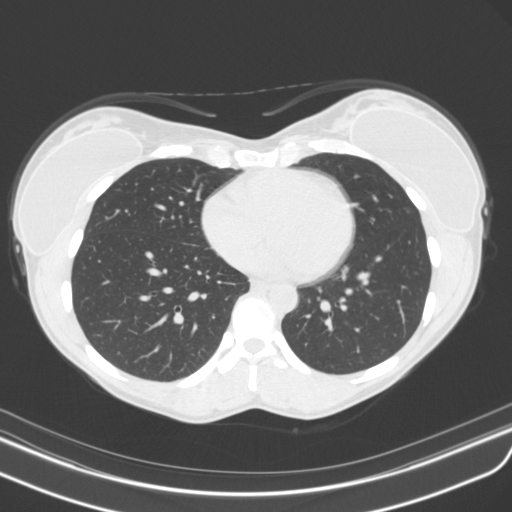
[im 81/168  lung]
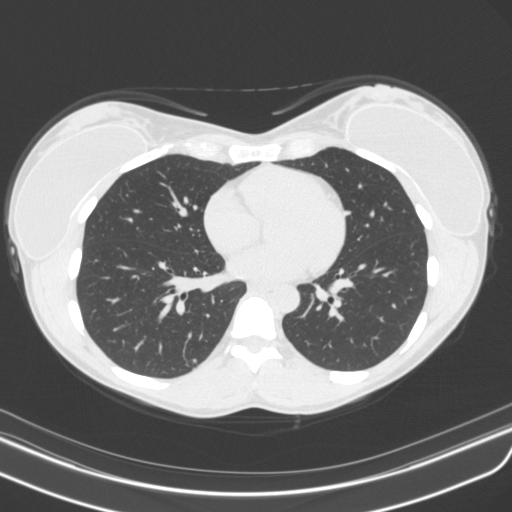
[im 89/168  mediastinal]
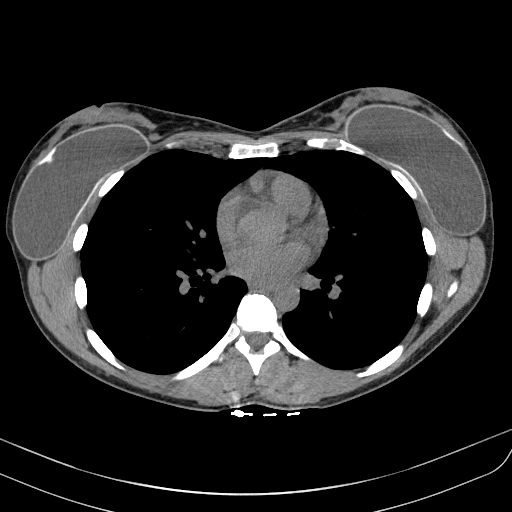
[im 89/168  lung]
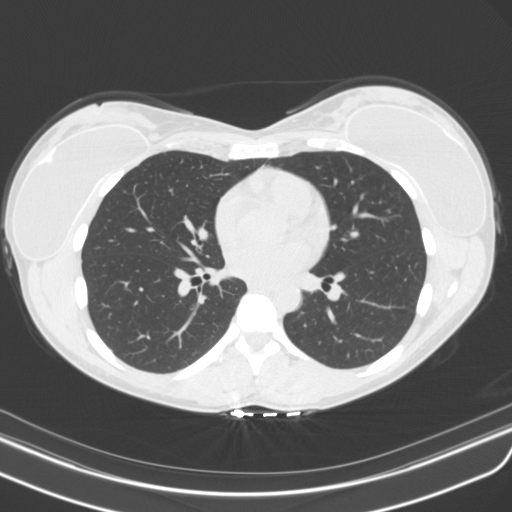
[im 99/168  lung]
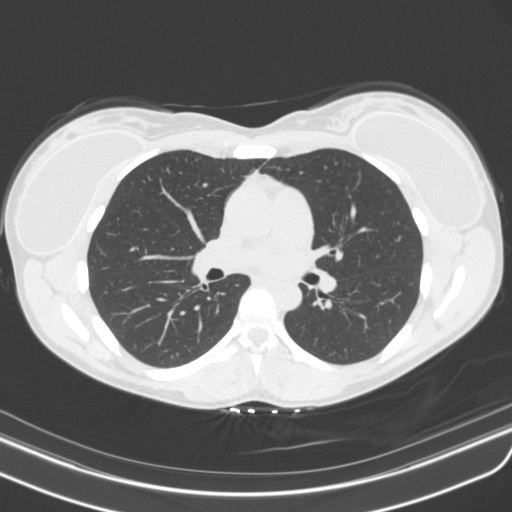
[im 106/168  lung]
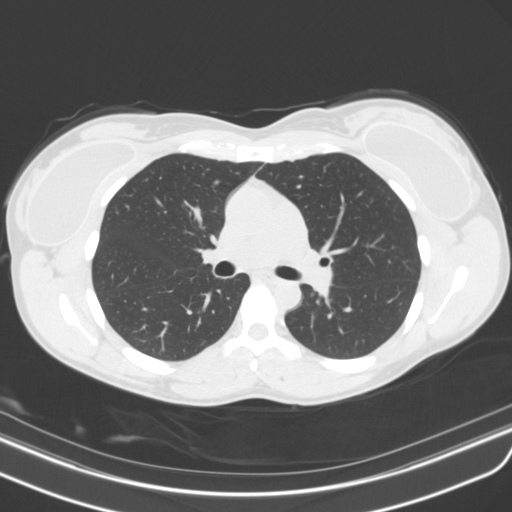
[im 118/168  lung]
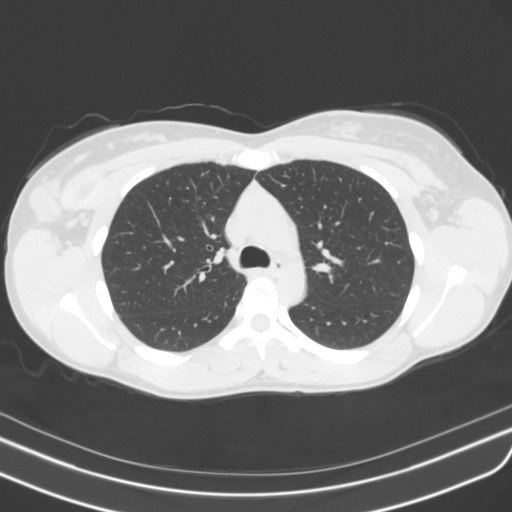
[im 130/168  mediastinal]
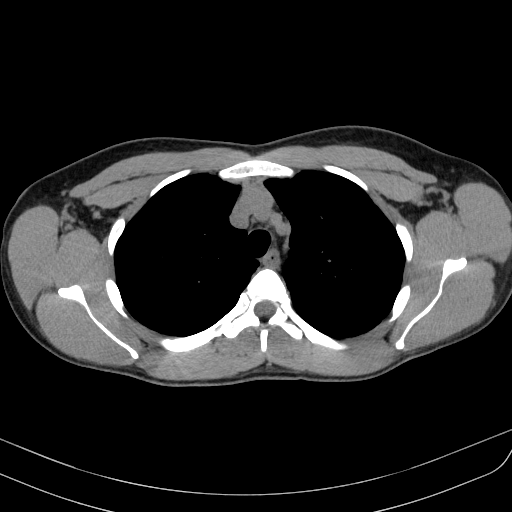
[im 130/168  lung]
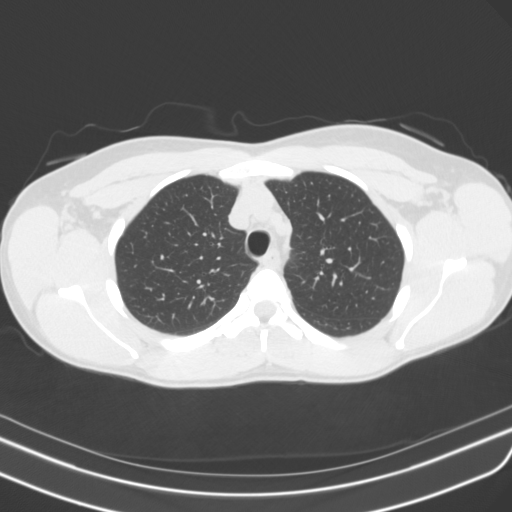
[im 137/168  lung]
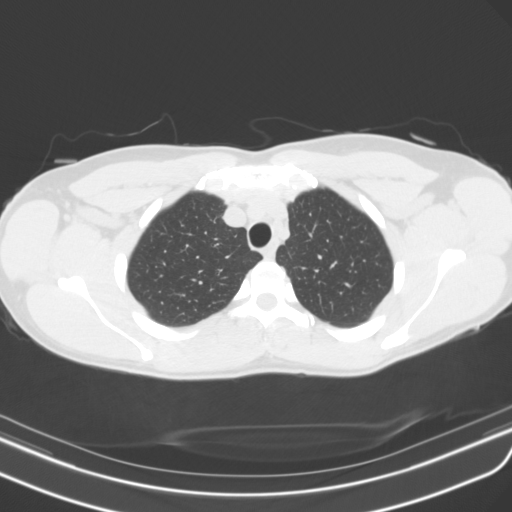
[im 149/168  lung]
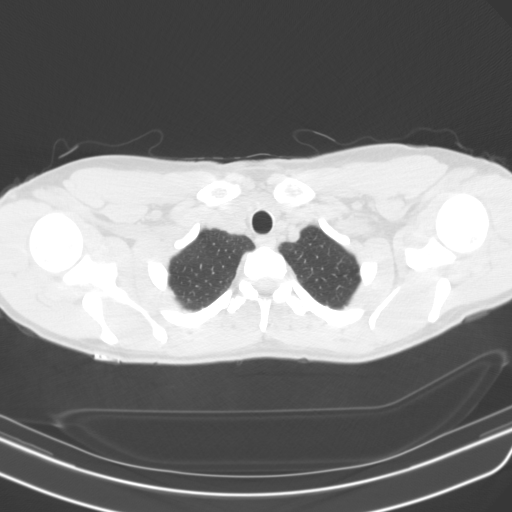
[im 161/168  lung]
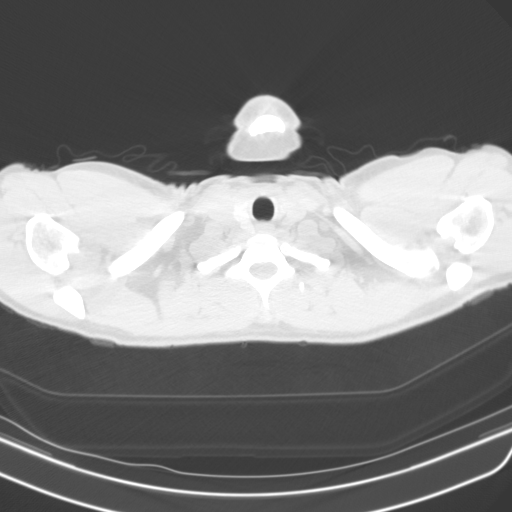

[16 of 34 positions shown; findings below may reference images not displayed]

FINDINGS: Cardiovascular: No significant vascular findings. Normal heart size.
No pericardial effusion.

Mediastinum/Nodes: No enlarged mediastinal or axillary lymph nodes.
Thyroid gland, trachea, and esophagus demonstrate no significant
findings.

Lungs/Pleura: A 0.3 cm left lower lobe nodule on image 109 of series
5 is unchanged. The lungs are otherwise clear. No pleural effusion.

Upper Abdomen: Single punctate nonobstructing stones are seen in
both kidneys.

Musculoskeletal: Negative.
IMPRESSION: No change in a 0.3 cm left lower lobe pulmonary nodule. No follow-up
imaging is recommended.

Punctate nonobstructing stones in each kidney.

## 2023-09-21 NOTE — Progress Notes (Signed)
 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  History of Present Illness Tasha Garcia is a 48 year old female with asthma and acid reflux who presents with breathing difficulties and medication management issues.  She experiences ongoing breathing difficulties, which worsen during stressful periods. Previously, she was on Dupixent, which significantly improved her breathing, but she can no longer afford it after exhausting all assistance programs. She missed her last dose scheduled for last Friday. Currently, she uses a Breztri inhaler every morning, which is covered by her insurance and provides relief. Occasionally, she uses it twice a day when feeling unwell.  Her asthma history includes recurrent cough, sinus issues, and a raspy voice. She was temporarily on Lexapro last summer but is not currently taking it. She has a history of smoking as a teenager but reports minimal lung damage or scarring. A previous CT scan revealed a small nodule on her left lower lobe, which has remained unchanged over time and is considered likely scarring.  She also suffers from significant acid reflux, managed with Dexilant and famotidine. Despite these medications, she continues to experience heartburn and acid reflux.  Her family history includes her mother having an autoimmune genetic condition, raising concerns about a potential underlying autoimmune issue in her. She has not been tested for this before.   Past Medical History:   Past Medical History:  Diagnosis Date  . Allergic rhinitis due to allergen    always had seasonal allergies esp to pollen  . Asthma, unspecified asthma severity, unspecified whether complicated, unspecified whether persistent (HHS-HCC)    never officially been diagnosed, have cardio  induced cough  . Bronchitis, chronic (CMS/HHS-HCC)   . GERD (gastroesophageal reflux disease) 2018, 2021   became worse in 2021  . History of pneumonia 2014 and March 2021   had walking pneumonia in 2014 and Covid pneumonia march 21  . Migraine 08/15/2013  . Sinusitis, unspecified    various times when I get sick    Past Surgical History:   Past Surgical History:  Procedure Laterality Date  . EGD  07/11/2020   Gastritis/Repeat as needed/CTL  . CESAREAN SECTION     x2  . HALLUX VALGUS CORRECTION Left     Allergies:   Allergies  Allergen Reactions  . Erythromycin Nausea    Current Medications:   Prior to Admission medications   Medication Sig Taking? Last Dose  albuterol MDI, PROVENTIL, VENTOLIN, PROAIR, HFA 90 mcg/actuation inhaler Inhale 2 inhalations into the lungs every 4 (four) hours as needed Yes Taking  budesonide-glycopyrrolate-formoterol (BREZTRI AEROSPHERE) 160-9-4.8 mcg/actuation inhaler Inhale 2 inhalations into the lungs 2 (two) times daily Yes Taking  dexlansoprazole (DEXILANT) 60 mg DR capsule Take 1 capsule (60 mg total) by mouth once daily Yes Taking  dupilumab (DUPIXENT) 300 mg/2 mL pen injector Inject 2 mLs (300 mg total) subcutaneously every 14 (fourteen) days Yes Taking  escitalopram oxalate (LEXAPRO) 10 MG tablet Take 1 tablet (10 mg total) by mouth once daily Yes Taking  famotidine (PEPCID) 40 MG tablet Take 1 tablet (40  mg total) by mouth at bedtime Yes Taking  levocetirizine (XYZAL) 5 MG tablet Take 5 mg by mouth every evening Yes Taking  montelukast (SINGULAIR) 10 mg tablet Take 1 tablet (10 mg total) by mouth once daily Yes Taking  multivitamin-Ca-iron-minerals Tab Take by mouth Yes Taking  predniSONE (DELTASONE) 10 MG tablet TAKE 1 TABLET (10 MG) BY MOUTH EVERY DAY Yes Taking  PROGESTERONE, BULK, MISC Use Topical progesterone cream once daily Yes Taking    Family History:   Family History  Problem Relation Name Age of Onset  . High blood  pressure (Hypertension) Mother Johnson Lesches   . Asthma Mother Johnson Lesches        Covid pneumonia and lung issues now  . Coronary Artery Disease (Blocked arteries around heart) Father    . Supraventricular tachycardia Father    . Coronary Artery Disease (Blocked arteries around heart) Paternal Grandfather    . Irregular Heart Beat (Arrhythmia) Brother    . Cancer Maternal Grandmother Earnie Shaw        died of breast cancer premenop    Social History:   Social History   Socioeconomic History  . Marital status: Married  Occupational History  . Occupation: Regulatory Affairs Officer  Tobacco Use  . Smoking status: Former    Current packs/day: 0.00    Average packs/day: 0.3 packs/day for 8.0 years (2.0 ttl pk-yrs)    Types: Cigarettes    Start date: 08/18/1988    Quit date: 08/18/1996    Years since quitting: 27.1  . Smokeless tobacco: Never  . Tobacco comments:    I smoked when I was younger, then only socially smoked in the late 90's  Vaping Use  . Vaping status: Never Used  Substance and Sexual Activity  . Alcohol use: Yes    Alcohol/week: 5.0 standard drinks of alcohol    Types: 1 Glasses of wine, 4 Cans of beer per week    Comment: casually drink on weekends  . Drug use: No  . Sexual activity: Yes    Partners: Male    Birth control/protection: Other-see comments    Comment: husband- vasectomy, I had a uterine ablation in 2010   Social Drivers of Health   Financial Resource Strain: Low Risk  (06/24/2023)   Overall Financial Resource Strain (CARDIA)   . Difficulty of Paying Living Expenses: Not hard at all  Food Insecurity: No Food Insecurity (06/24/2023)   Hunger Vital Sign   . Worried About Programme Researcher, Broadcasting/film/video in the Last Year: Never true   . Ran Out of Food in the Last Year: Never true  Transportation Needs: No Transportation Needs (06/24/2023)   PRAPARE - Transportation   . Lack of Transportation (Medical): No   . Lack of Transportation (Non-Medical): No  Housing  Stability: Unknown (06/24/2023)   Housing Stability Vital Sign   . Unable to Pay for Housing in the Last Year: No   . Homeless in the Last Year: No    Review of Systems:   A 10 point review of systems is negative, except for the pertinent positives and negatives detailed in the HPI.  Vitals:   Vitals:   09/21/23 1118  BP: 135/86  BP Location: Right upper arm  Patient Position: Sitting  BP Cuff Size: Adult  Pulse: 75  SpO2: 99%  Weight: 64.9 kg (143 lb)  Height: 159.5 cm (5' 2.8)     Body mass index is 25.49 kg/m.  Physical Exam:  Physical Exam Vitals and nursing  note reviewed.  Constitutional:      General: She is not in acute distress.    Appearance: Normal appearance. She is not ill-appearing, toxic-appearing or diaphoretic.  HENT:     Head: Normocephalic and atraumatic.     Right Ear: External ear normal.     Left Ear: External ear normal.  Eyes:     General:        Right eye: No discharge.        Left eye: No discharge.     Extraocular Movements: Extraocular movements intact.     Pupils: Pupils are equal, round, and reactive to light.  Cardiovascular:     Rate and Rhythm: Normal rate and regular rhythm.     Pulses: Normal pulses.     Heart sounds: Normal heart sounds. No murmur heard.    No friction rub. No gallop.  Abdominal:     General: Bowel sounds are normal.  Skin:    General: Skin is warm and dry.     Capillary Refill: Capillary refill takes less than 2 seconds.  Neurological:     Mental Status: She is alert.     Lab and Imaging Results:  Results     Assessment and Plan:   Diagnoses and all orders for this visit:  Nodule of lower lobe of left lung  Moderate persistent asthma, unspecified whether complicated (HHS-HCC)  Gastroesophageal reflux disease without esophagitis    Assessment & Plan Moderate persistent atopic asthma Moderate persistent atopic asthma with previous good response to Dupixent, but financial constraints prevent  continued use. Fasenra chosen as the next option due to its dosing schedule and potential effectiveness, targeting the IL-5 pathway involved in airway swelling. - Initiate Fasenra as the next biologic treatment. - Discontinue Dupixent due to financial constraints. - Ensure Jonell is obtained through a specialty pharmacy with assistance program.  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 Potential benefit from alternative biologic therapy with Fasenra.  Pulmonary Nodule Pulmonary nodule in the left lower lobe, unchanged on repeat CT scan, considered to be scarring with no current concern for malignancy.  Gastroesophageal Reflux Disease (GERD) Chronic GERD with persistent symptoms despite current medication regimen including Dexilant and famotidine. Discussion of potential surgical intervention with fundoplication to reduce long-term medication use and associated risks such as osteoporosis and electrolyte imbalances. She is interested in pursuing surgical evaluation. - Refer for surgical evaluation for fundoplication. - Perform endoscopy to rule out malignancy prior to surgical intervention. - Discuss potential referral to a robotic surgery specialist for fundoplication.       This note has been created using dictation software tool and any typographical errors are purely unintentional.  Patient received an After Visit Summary

## 2024-03-07 ENCOUNTER — Other Ambulatory Visit (HOSPITAL_COMMUNITY): Payer: Self-pay | Admitting: Surgery

## 2024-03-07 DIAGNOSIS — K219 Gastro-esophageal reflux disease without esophagitis: Secondary | ICD-10-CM

## 2024-03-07 DIAGNOSIS — R142 Eructation: Secondary | ICD-10-CM

## 2024-03-07 DIAGNOSIS — R140 Abdominal distension (gaseous): Secondary | ICD-10-CM

## 2024-03-07 DIAGNOSIS — R490 Dysphonia: Secondary | ICD-10-CM

## 2024-05-05 ENCOUNTER — Other Ambulatory Visit
